# Patient Record
Sex: Female | Born: 1935
Health system: Southern US, Community
[De-identification: ages and names within clinical notes are randomized; demographics above are authoritative.]

## PROBLEM LIST (undated history)

## (undated) DIAGNOSIS — I340 Nonrheumatic mitral (valve) insufficiency: Secondary | ICD-10-CM

## (undated) DIAGNOSIS — M810 Age-related osteoporosis without current pathological fracture: Secondary | ICD-10-CM

## (undated) DIAGNOSIS — I10 Essential (primary) hypertension: Secondary | ICD-10-CM

## (undated) DIAGNOSIS — E7800 Pure hypercholesterolemia, unspecified: Secondary | ICD-10-CM

## (undated) DIAGNOSIS — G4730 Sleep apnea, unspecified: Secondary | ICD-10-CM

## (undated) DIAGNOSIS — I509 Heart failure, unspecified: Secondary | ICD-10-CM

## (undated) DIAGNOSIS — I4891 Unspecified atrial fibrillation: Secondary | ICD-10-CM

## (undated) DIAGNOSIS — F0390 Unspecified dementia without behavioral disturbance: Secondary | ICD-10-CM

## (undated) HISTORY — DX: Sleep apnea, unspecified: G47.30

## (undated) HISTORY — DX: Unspecified atrial fibrillation: I48.91

## (undated) HISTORY — DX: Age-related osteoporosis without current pathological fracture: M81.0

## (undated) HISTORY — DX: Nonrheumatic mitral (valve) insufficiency: I34.0

## (undated) HISTORY — DX: Pure hypercholesterolemia, unspecified: E78.00

## (undated) HISTORY — DX: Essential (primary) hypertension: I10

## (undated) HISTORY — DX: Unspecified dementia, unspecified severity, without behavioral disturbance, psychotic disturbance, mood disturbance, and anxiety: F03.90

---

## 2004-05-01 ENCOUNTER — Emergency Department (HOSPITAL_COMMUNITY): Admission: EM | Admit: 2004-05-01 | Discharge: 2004-05-01 | Payer: Self-pay | Admitting: *Deleted

## 2005-08-21 IMAGING — CR DG CERVICAL SPINE COMPLETE 4+V
6 series · 6 of 6 positions shown · non-contrast
Comparison: none

CLINICAL DATA: Fell onto face.  Posterior neck pain. 
 CERVICAL SPINE 5 VIEWS

[view not recorded (1 of 6)]
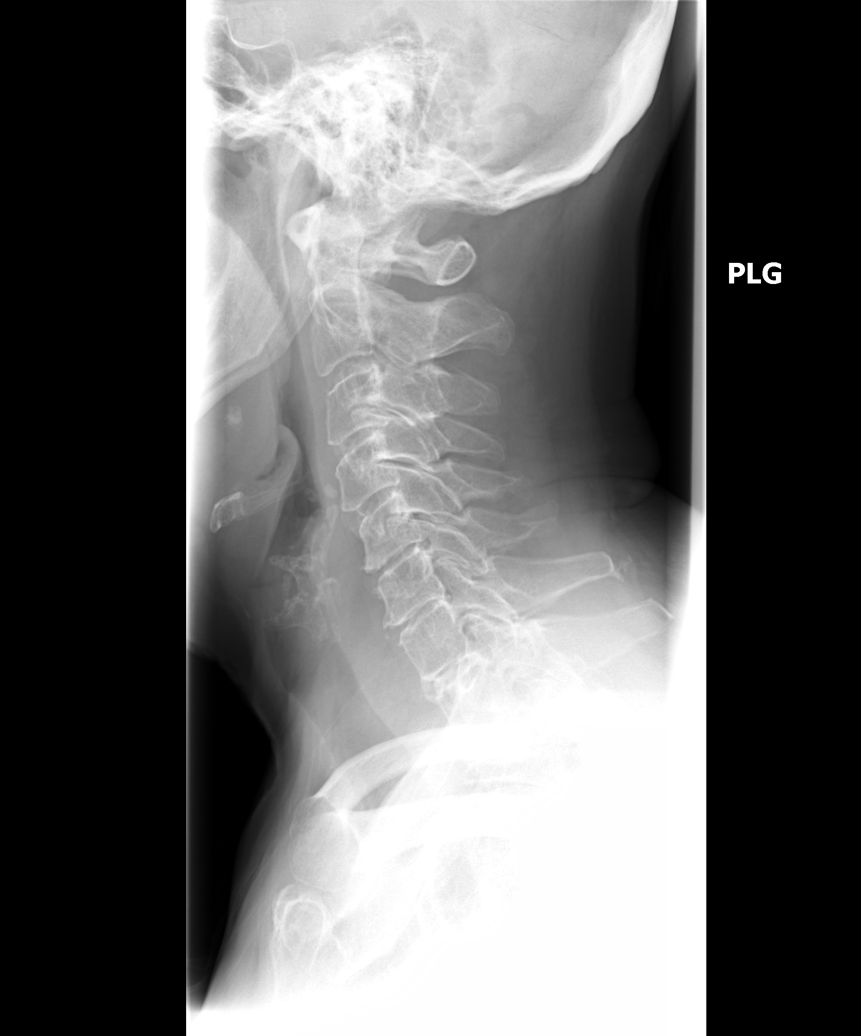

[view not recorded (2 of 6)]
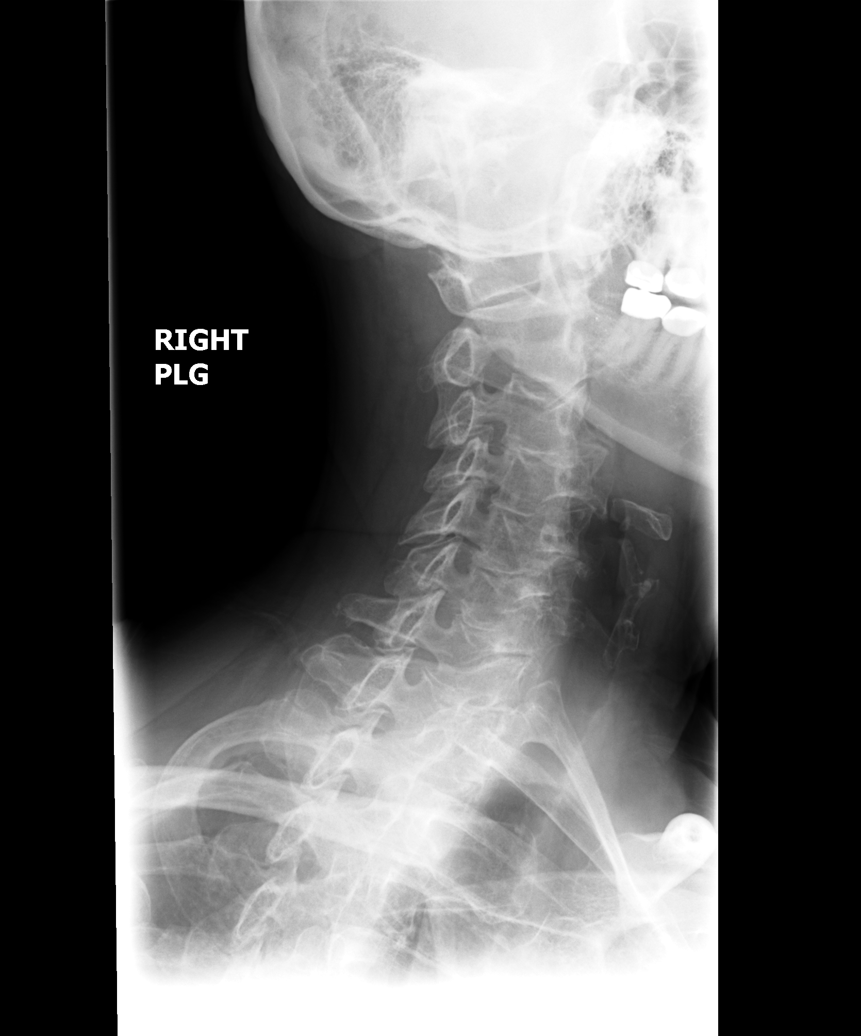

[view not recorded (3 of 6)]
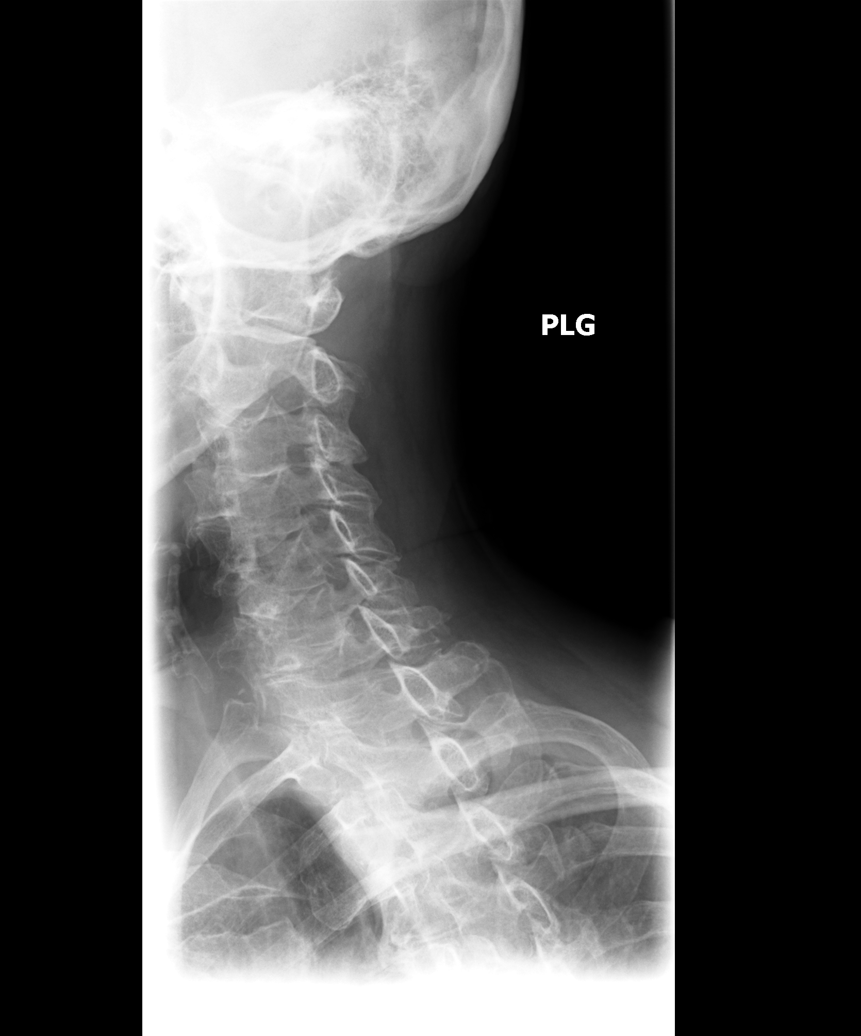

[view not recorded (4 of 6)]
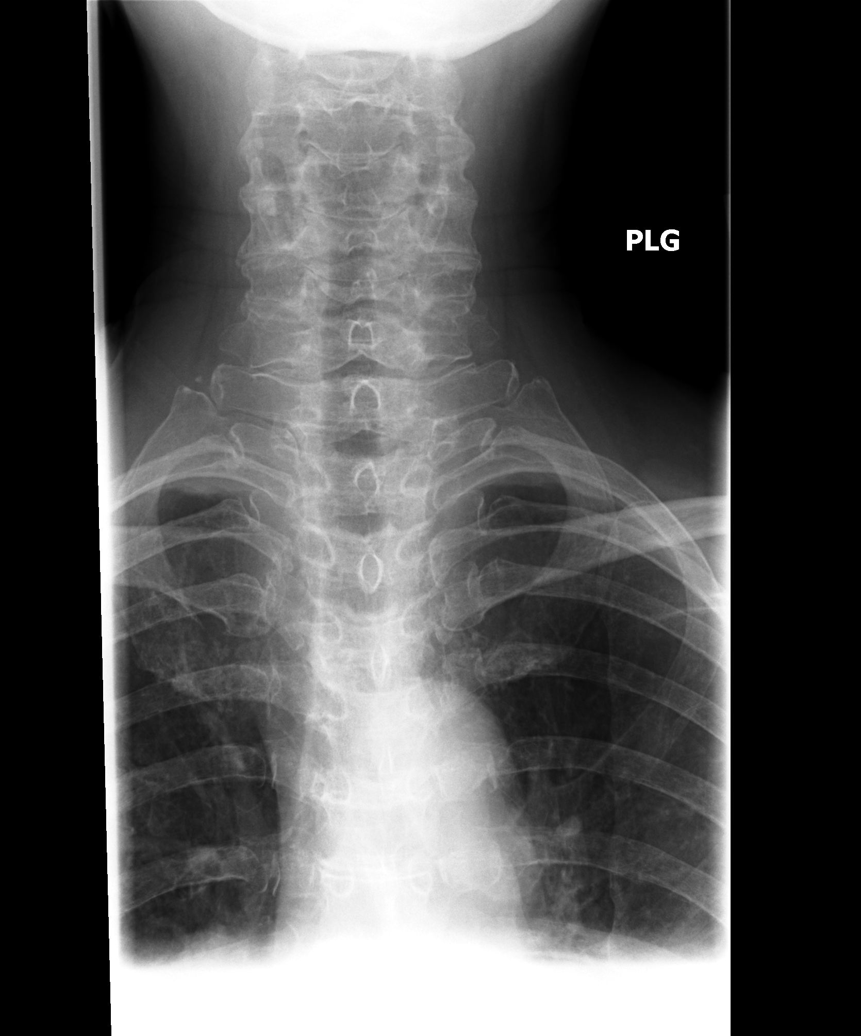

[view not recorded (5 of 6)]
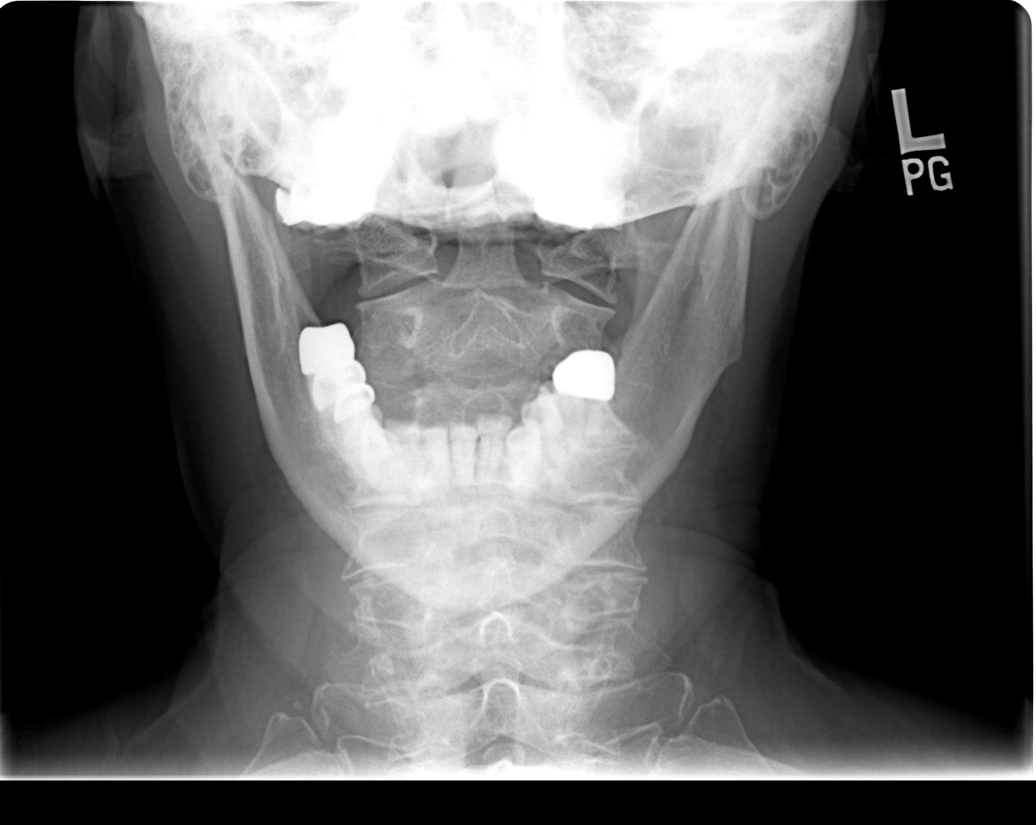

[view not recorded (6 of 6)]
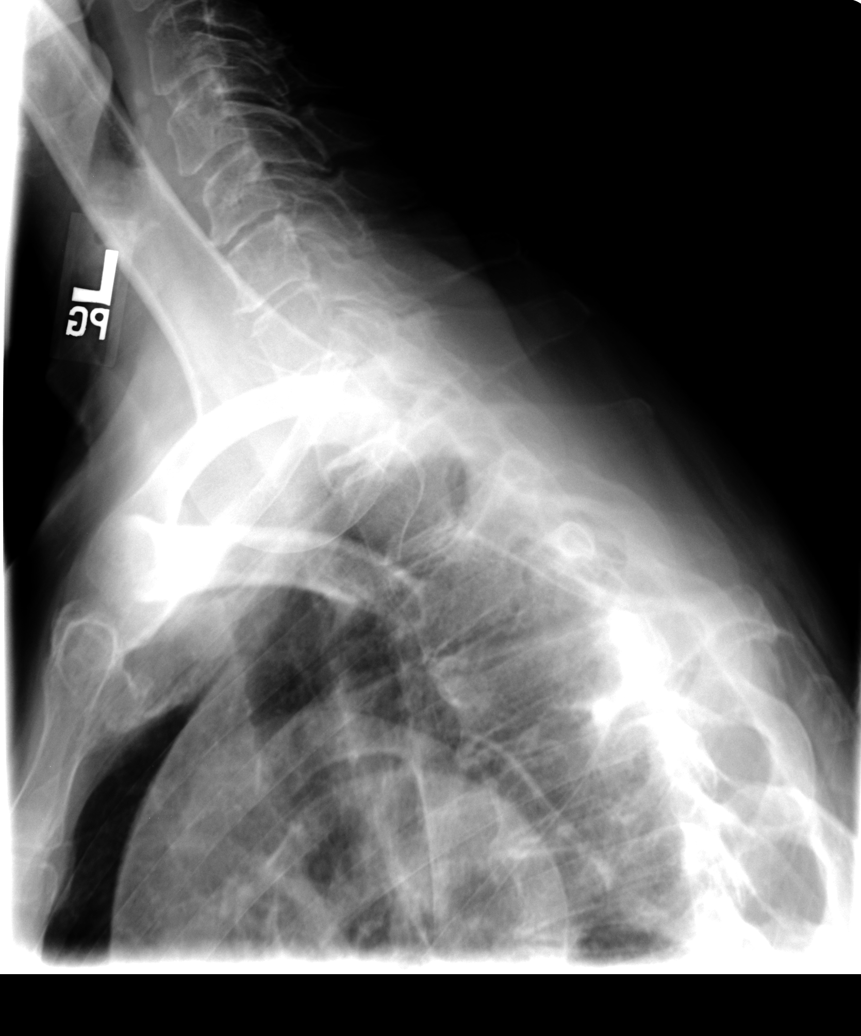

[6 of 6 positions shown; findings below may reference images not displayed]

FINDINGS: Narrowing of interspaces from C3 through C7 with small end-plate spurs at all levels.  There is also anterior spurring at C7-T1.  Normal alignment.  Uncovertebral hypertrophy results in some early neuroforaminal encroachment at C3-4, C4-5, and C5-6.  Negative for fracture or other acute abnormality.
IMPRESSION: Negative for fracture. 
 Multilevel degenerative changes, as above.

## 2013-09-24 DIAGNOSIS — I340 Nonrheumatic mitral (valve) insufficiency: Secondary | ICD-10-CM

## 2013-09-24 HISTORY — DX: Nonrheumatic mitral (valve) insufficiency: I34.0

## 2015-10-01 DIAGNOSIS — I482 Chronic atrial fibrillation: Secondary | ICD-10-CM | POA: Diagnosis not present

## 2015-10-10 DIAGNOSIS — N390 Urinary tract infection, site not specified: Secondary | ICD-10-CM | POA: Diagnosis not present

## 2015-10-10 DIAGNOSIS — N812 Incomplete uterovaginal prolapse: Secondary | ICD-10-CM | POA: Diagnosis not present

## 2015-10-10 DIAGNOSIS — N816 Rectocele: Secondary | ICD-10-CM | POA: Diagnosis not present

## 2015-10-31 DIAGNOSIS — N993 Prolapse of vaginal vault after hysterectomy: Secondary | ICD-10-CM | POA: Diagnosis not present

## 2015-12-02 DIAGNOSIS — I482 Chronic atrial fibrillation: Secondary | ICD-10-CM | POA: Diagnosis not present

## 2015-12-27 DIAGNOSIS — N993 Prolapse of vaginal vault after hysterectomy: Secondary | ICD-10-CM | POA: Diagnosis not present

## 2015-12-29 DIAGNOSIS — W108XXA Fall (on) (from) other stairs and steps, initial encounter: Secondary | ICD-10-CM | POA: Diagnosis not present

## 2015-12-29 DIAGNOSIS — I340 Nonrheumatic mitral (valve) insufficiency: Secondary | ICD-10-CM | POA: Diagnosis not present

## 2015-12-29 DIAGNOSIS — I482 Chronic atrial fibrillation: Secondary | ICD-10-CM | POA: Diagnosis not present

## 2015-12-29 DIAGNOSIS — I351 Nonrheumatic aortic (valve) insufficiency: Secondary | ICD-10-CM | POA: Diagnosis not present

## 2015-12-29 DIAGNOSIS — G4730 Sleep apnea, unspecified: Secondary | ICD-10-CM | POA: Diagnosis not present

## 2015-12-29 DIAGNOSIS — I10 Essential (primary) hypertension: Secondary | ICD-10-CM | POA: Diagnosis not present

## 2016-01-04 DIAGNOSIS — I482 Chronic atrial fibrillation: Secondary | ICD-10-CM | POA: Diagnosis not present

## 2016-01-04 DIAGNOSIS — N813 Complete uterovaginal prolapse: Secondary | ICD-10-CM | POA: Diagnosis not present

## 2016-02-15 DIAGNOSIS — R399 Unspecified symptoms and signs involving the genitourinary system: Secondary | ICD-10-CM | POA: Diagnosis not present

## 2016-02-15 DIAGNOSIS — M810 Age-related osteoporosis without current pathological fracture: Secondary | ICD-10-CM | POA: Diagnosis not present

## 2016-02-15 DIAGNOSIS — I4891 Unspecified atrial fibrillation: Secondary | ICD-10-CM | POA: Diagnosis not present

## 2016-02-15 DIAGNOSIS — Z1231 Encounter for screening mammogram for malignant neoplasm of breast: Secondary | ICD-10-CM | POA: Diagnosis not present

## 2016-02-15 DIAGNOSIS — E785 Hyperlipidemia, unspecified: Secondary | ICD-10-CM | POA: Diagnosis not present

## 2016-02-15 DIAGNOSIS — I10 Essential (primary) hypertension: Secondary | ICD-10-CM | POA: Diagnosis not present

## 2016-02-15 DIAGNOSIS — F0390 Unspecified dementia without behavioral disturbance: Secondary | ICD-10-CM | POA: Diagnosis not present

## 2016-03-01 DIAGNOSIS — I482 Chronic atrial fibrillation: Secondary | ICD-10-CM | POA: Diagnosis not present

## 2016-03-29 DIAGNOSIS — S42242D 4-part fracture of surgical neck of left humerus, subsequent encounter for fracture with routine healing: Secondary | ICD-10-CM | POA: Diagnosis not present

## 2016-04-25 DIAGNOSIS — I482 Chronic atrial fibrillation: Secondary | ICD-10-CM | POA: Diagnosis not present

## 2016-06-06 DIAGNOSIS — I482 Chronic atrial fibrillation: Secondary | ICD-10-CM | POA: Diagnosis not present

## 2016-06-28 DIAGNOSIS — G4730 Sleep apnea, unspecified: Secondary | ICD-10-CM | POA: Diagnosis not present

## 2016-06-28 DIAGNOSIS — I482 Chronic atrial fibrillation: Secondary | ICD-10-CM | POA: Diagnosis not present

## 2016-06-28 DIAGNOSIS — I10 Essential (primary) hypertension: Secondary | ICD-10-CM | POA: Diagnosis not present

## 2016-06-28 DIAGNOSIS — W108XXA Fall (on) (from) other stairs and steps, initial encounter: Secondary | ICD-10-CM | POA: Diagnosis not present

## 2016-06-28 DIAGNOSIS — I351 Nonrheumatic aortic (valve) insufficiency: Secondary | ICD-10-CM | POA: Diagnosis not present

## 2016-06-28 DIAGNOSIS — I340 Nonrheumatic mitral (valve) insufficiency: Secondary | ICD-10-CM | POA: Diagnosis not present

## 2016-07-03 DIAGNOSIS — Z23 Encounter for immunization: Secondary | ICD-10-CM | POA: Diagnosis not present

## 2016-07-27 DIAGNOSIS — I482 Chronic atrial fibrillation: Secondary | ICD-10-CM | POA: Diagnosis not present

## 2016-09-28 DIAGNOSIS — M810 Age-related osteoporosis without current pathological fracture: Secondary | ICD-10-CM | POA: Diagnosis not present

## 2016-09-28 DIAGNOSIS — E785 Hyperlipidemia, unspecified: Secondary | ICD-10-CM | POA: Diagnosis not present

## 2016-09-28 DIAGNOSIS — Z7901 Long term (current) use of anticoagulants: Secondary | ICD-10-CM | POA: Diagnosis not present

## 2016-09-28 DIAGNOSIS — G4730 Sleep apnea, unspecified: Secondary | ICD-10-CM | POA: Diagnosis not present

## 2016-09-28 DIAGNOSIS — I4891 Unspecified atrial fibrillation: Secondary | ICD-10-CM | POA: Diagnosis not present

## 2016-09-28 DIAGNOSIS — I10 Essential (primary) hypertension: Secondary | ICD-10-CM | POA: Diagnosis not present

## 2016-09-28 DIAGNOSIS — Z780 Asymptomatic menopausal state: Secondary | ICD-10-CM | POA: Diagnosis not present

## 2016-12-27 DIAGNOSIS — W108XXA Fall (on) (from) other stairs and steps, initial encounter: Secondary | ICD-10-CM | POA: Diagnosis not present

## 2016-12-27 DIAGNOSIS — I10 Essential (primary) hypertension: Secondary | ICD-10-CM | POA: Diagnosis not present

## 2016-12-27 DIAGNOSIS — G4730 Sleep apnea, unspecified: Secondary | ICD-10-CM | POA: Diagnosis not present

## 2016-12-27 DIAGNOSIS — I482 Chronic atrial fibrillation: Secondary | ICD-10-CM | POA: Diagnosis not present

## 2016-12-27 DIAGNOSIS — I351 Nonrheumatic aortic (valve) insufficiency: Secondary | ICD-10-CM | POA: Diagnosis not present

## 2016-12-27 DIAGNOSIS — I340 Nonrheumatic mitral (valve) insufficiency: Secondary | ICD-10-CM | POA: Diagnosis not present

## 2017-01-18 DIAGNOSIS — I482 Chronic atrial fibrillation: Secondary | ICD-10-CM | POA: Diagnosis not present

## 2017-02-05 DIAGNOSIS — E7800 Pure hypercholesterolemia, unspecified: Secondary | ICD-10-CM | POA: Diagnosis not present

## 2017-02-05 DIAGNOSIS — I4891 Unspecified atrial fibrillation: Secondary | ICD-10-CM | POA: Diagnosis not present

## 2017-02-05 DIAGNOSIS — G4730 Sleep apnea, unspecified: Secondary | ICD-10-CM | POA: Diagnosis not present

## 2017-02-05 DIAGNOSIS — Z9181 History of falling: Secondary | ICD-10-CM | POA: Diagnosis not present

## 2017-02-05 DIAGNOSIS — Z79899 Other long term (current) drug therapy: Secondary | ICD-10-CM | POA: Diagnosis not present

## 2017-02-05 DIAGNOSIS — T461X6A Underdosing of calcium-channel blockers, initial encounter: Secondary | ICD-10-CM | POA: Diagnosis not present

## 2017-02-05 DIAGNOSIS — I482 Chronic atrial fibrillation: Secondary | ICD-10-CM | POA: Diagnosis not present

## 2017-02-05 DIAGNOSIS — J90 Pleural effusion, not elsewhere classified: Secondary | ICD-10-CM | POA: Diagnosis not present

## 2017-02-05 DIAGNOSIS — I5031 Acute diastolic (congestive) heart failure: Secondary | ICD-10-CM | POA: Diagnosis not present

## 2017-02-05 DIAGNOSIS — I081 Rheumatic disorders of both mitral and tricuspid valves: Secondary | ICD-10-CM | POA: Diagnosis not present

## 2017-02-05 DIAGNOSIS — Z91138 Patient's unintentional underdosing of medication regimen for other reason: Secondary | ICD-10-CM | POA: Diagnosis not present

## 2017-02-05 DIAGNOSIS — I5021 Acute systolic (congestive) heart failure: Secondary | ICD-10-CM | POA: Diagnosis not present

## 2017-02-05 DIAGNOSIS — I10 Essential (primary) hypertension: Secondary | ICD-10-CM | POA: Diagnosis not present

## 2017-02-05 DIAGNOSIS — I080 Rheumatic disorders of both mitral and aortic valves: Secondary | ICD-10-CM | POA: Diagnosis not present

## 2017-02-05 DIAGNOSIS — Z7901 Long term (current) use of anticoagulants: Secondary | ICD-10-CM | POA: Diagnosis not present

## 2017-02-05 DIAGNOSIS — I110 Hypertensive heart disease with heart failure: Secondary | ICD-10-CM | POA: Diagnosis not present

## 2017-02-05 DIAGNOSIS — I509 Heart failure, unspecified: Secondary | ICD-10-CM | POA: Diagnosis not present

## 2017-02-05 DIAGNOSIS — Z66 Do not resuscitate: Secondary | ICD-10-CM | POA: Diagnosis not present

## 2017-02-06 DIAGNOSIS — I080 Rheumatic disorders of both mitral and aortic valves: Secondary | ICD-10-CM | POA: Diagnosis not present

## 2017-02-06 DIAGNOSIS — I5021 Acute systolic (congestive) heart failure: Secondary | ICD-10-CM | POA: Diagnosis not present

## 2017-02-06 DIAGNOSIS — I482 Chronic atrial fibrillation: Secondary | ICD-10-CM | POA: Diagnosis not present

## 2017-02-06 DIAGNOSIS — I361 Nonrheumatic tricuspid (valve) insufficiency: Secondary | ICD-10-CM | POA: Diagnosis not present

## 2017-02-06 DIAGNOSIS — I5031 Acute diastolic (congestive) heart failure: Secondary | ICD-10-CM | POA: Diagnosis not present

## 2017-02-06 DIAGNOSIS — I4891 Unspecified atrial fibrillation: Secondary | ICD-10-CM | POA: Diagnosis not present

## 2017-02-06 DIAGNOSIS — I509 Heart failure, unspecified: Secondary | ICD-10-CM | POA: Diagnosis not present

## 2017-02-06 DIAGNOSIS — Z79899 Other long term (current) drug therapy: Secondary | ICD-10-CM | POA: Diagnosis not present

## 2017-02-06 DIAGNOSIS — G4730 Sleep apnea, unspecified: Secondary | ICD-10-CM | POA: Diagnosis not present

## 2017-02-06 DIAGNOSIS — I10 Essential (primary) hypertension: Secondary | ICD-10-CM | POA: Diagnosis not present

## 2017-02-06 DIAGNOSIS — Z7901 Long term (current) use of anticoagulants: Secondary | ICD-10-CM | POA: Diagnosis not present

## 2017-02-06 DIAGNOSIS — Z66 Do not resuscitate: Secondary | ICD-10-CM | POA: Diagnosis not present

## 2017-02-06 DIAGNOSIS — I340 Nonrheumatic mitral (valve) insufficiency: Secondary | ICD-10-CM | POA: Diagnosis not present

## 2017-02-07 DIAGNOSIS — I5021 Acute systolic (congestive) heart failure: Secondary | ICD-10-CM | POA: Diagnosis not present

## 2017-02-07 DIAGNOSIS — Z66 Do not resuscitate: Secondary | ICD-10-CM | POA: Diagnosis not present

## 2017-02-07 DIAGNOSIS — I080 Rheumatic disorders of both mitral and aortic valves: Secondary | ICD-10-CM | POA: Diagnosis not present

## 2017-02-07 DIAGNOSIS — Z79899 Other long term (current) drug therapy: Secondary | ICD-10-CM | POA: Diagnosis not present

## 2017-02-07 DIAGNOSIS — I10 Essential (primary) hypertension: Secondary | ICD-10-CM | POA: Diagnosis not present

## 2017-02-07 DIAGNOSIS — I5031 Acute diastolic (congestive) heart failure: Secondary | ICD-10-CM | POA: Diagnosis not present

## 2017-02-07 DIAGNOSIS — G4730 Sleep apnea, unspecified: Secondary | ICD-10-CM | POA: Diagnosis not present

## 2017-02-07 DIAGNOSIS — I4891 Unspecified atrial fibrillation: Secondary | ICD-10-CM | POA: Diagnosis not present

## 2017-02-07 DIAGNOSIS — Z7901 Long term (current) use of anticoagulants: Secondary | ICD-10-CM | POA: Diagnosis not present

## 2017-02-07 DIAGNOSIS — I482 Chronic atrial fibrillation: Secondary | ICD-10-CM | POA: Diagnosis not present

## 2017-02-08 DIAGNOSIS — G4730 Sleep apnea, unspecified: Secondary | ICD-10-CM | POA: Diagnosis not present

## 2017-02-08 DIAGNOSIS — Z5181 Encounter for therapeutic drug level monitoring: Secondary | ICD-10-CM | POA: Diagnosis not present

## 2017-02-08 DIAGNOSIS — I110 Hypertensive heart disease with heart failure: Secondary | ICD-10-CM | POA: Diagnosis not present

## 2017-02-08 DIAGNOSIS — Z7901 Long term (current) use of anticoagulants: Secondary | ICD-10-CM | POA: Diagnosis not present

## 2017-02-08 DIAGNOSIS — I083 Combined rheumatic disorders of mitral, aortic and tricuspid valves: Secondary | ICD-10-CM | POA: Diagnosis not present

## 2017-02-08 DIAGNOSIS — I482 Chronic atrial fibrillation: Secondary | ICD-10-CM | POA: Diagnosis not present

## 2017-02-08 DIAGNOSIS — Z9181 History of falling: Secondary | ICD-10-CM | POA: Diagnosis not present

## 2017-02-08 DIAGNOSIS — I5031 Acute diastolic (congestive) heart failure: Secondary | ICD-10-CM | POA: Diagnosis not present

## 2017-02-08 DIAGNOSIS — I272 Pulmonary hypertension, unspecified: Secondary | ICD-10-CM | POA: Diagnosis not present

## 2017-02-09 DIAGNOSIS — I5031 Acute diastolic (congestive) heart failure: Secondary | ICD-10-CM | POA: Diagnosis not present

## 2017-02-09 DIAGNOSIS — G4730 Sleep apnea, unspecified: Secondary | ICD-10-CM | POA: Diagnosis not present

## 2017-02-09 DIAGNOSIS — I272 Pulmonary hypertension, unspecified: Secondary | ICD-10-CM | POA: Diagnosis not present

## 2017-02-09 DIAGNOSIS — I083 Combined rheumatic disorders of mitral, aortic and tricuspid valves: Secondary | ICD-10-CM | POA: Diagnosis not present

## 2017-02-09 DIAGNOSIS — I110 Hypertensive heart disease with heart failure: Secondary | ICD-10-CM | POA: Diagnosis not present

## 2017-02-09 DIAGNOSIS — I482 Chronic atrial fibrillation: Secondary | ICD-10-CM | POA: Diagnosis not present

## 2017-02-11 DIAGNOSIS — G4730 Sleep apnea, unspecified: Secondary | ICD-10-CM | POA: Diagnosis not present

## 2017-02-11 DIAGNOSIS — I272 Pulmonary hypertension, unspecified: Secondary | ICD-10-CM | POA: Diagnosis not present

## 2017-02-11 DIAGNOSIS — I5031 Acute diastolic (congestive) heart failure: Secondary | ICD-10-CM | POA: Diagnosis not present

## 2017-02-11 DIAGNOSIS — I110 Hypertensive heart disease with heart failure: Secondary | ICD-10-CM | POA: Diagnosis not present

## 2017-02-11 DIAGNOSIS — I083 Combined rheumatic disorders of mitral, aortic and tricuspid valves: Secondary | ICD-10-CM | POA: Diagnosis not present

## 2017-02-11 DIAGNOSIS — I482 Chronic atrial fibrillation: Secondary | ICD-10-CM | POA: Diagnosis not present

## 2017-02-12 DIAGNOSIS — Z713 Dietary counseling and surveillance: Secondary | ICD-10-CM | POA: Diagnosis not present

## 2017-02-12 DIAGNOSIS — Z7901 Long term (current) use of anticoagulants: Secondary | ICD-10-CM | POA: Diagnosis not present

## 2017-02-12 DIAGNOSIS — G4730 Sleep apnea, unspecified: Secondary | ICD-10-CM | POA: Diagnosis not present

## 2017-02-12 DIAGNOSIS — I272 Pulmonary hypertension, unspecified: Secondary | ICD-10-CM | POA: Diagnosis not present

## 2017-02-12 DIAGNOSIS — I110 Hypertensive heart disease with heart failure: Secondary | ICD-10-CM | POA: Diagnosis not present

## 2017-02-12 DIAGNOSIS — I4891 Unspecified atrial fibrillation: Secondary | ICD-10-CM | POA: Diagnosis not present

## 2017-02-12 DIAGNOSIS — I482 Chronic atrial fibrillation: Secondary | ICD-10-CM | POA: Diagnosis not present

## 2017-02-12 DIAGNOSIS — I083 Combined rheumatic disorders of mitral, aortic and tricuspid valves: Secondary | ICD-10-CM | POA: Diagnosis not present

## 2017-02-12 DIAGNOSIS — F0390 Unspecified dementia without behavioral disturbance: Secondary | ICD-10-CM | POA: Diagnosis not present

## 2017-02-12 DIAGNOSIS — I5031 Acute diastolic (congestive) heart failure: Secondary | ICD-10-CM | POA: Diagnosis not present

## 2017-02-12 DIAGNOSIS — E785 Hyperlipidemia, unspecified: Secondary | ICD-10-CM | POA: Diagnosis not present

## 2017-02-12 DIAGNOSIS — I10 Essential (primary) hypertension: Secondary | ICD-10-CM | POA: Diagnosis not present

## 2017-02-13 DIAGNOSIS — I5031 Acute diastolic (congestive) heart failure: Secondary | ICD-10-CM | POA: Diagnosis not present

## 2017-02-13 DIAGNOSIS — I482 Chronic atrial fibrillation: Secondary | ICD-10-CM | POA: Diagnosis not present

## 2017-02-13 DIAGNOSIS — I272 Pulmonary hypertension, unspecified: Secondary | ICD-10-CM | POA: Diagnosis not present

## 2017-02-13 DIAGNOSIS — I083 Combined rheumatic disorders of mitral, aortic and tricuspid valves: Secondary | ICD-10-CM | POA: Diagnosis not present

## 2017-02-13 DIAGNOSIS — I110 Hypertensive heart disease with heart failure: Secondary | ICD-10-CM | POA: Diagnosis not present

## 2017-02-13 DIAGNOSIS — G4730 Sleep apnea, unspecified: Secondary | ICD-10-CM | POA: Diagnosis not present

## 2017-02-14 DIAGNOSIS — G4730 Sleep apnea, unspecified: Secondary | ICD-10-CM | POA: Diagnosis not present

## 2017-02-14 DIAGNOSIS — I5031 Acute diastolic (congestive) heart failure: Secondary | ICD-10-CM | POA: Diagnosis not present

## 2017-02-14 DIAGNOSIS — I482 Chronic atrial fibrillation: Secondary | ICD-10-CM | POA: Diagnosis not present

## 2017-02-14 DIAGNOSIS — I110 Hypertensive heart disease with heart failure: Secondary | ICD-10-CM | POA: Diagnosis not present

## 2017-02-14 DIAGNOSIS — I10 Essential (primary) hypertension: Secondary | ICD-10-CM | POA: Diagnosis not present

## 2017-02-14 DIAGNOSIS — I083 Combined rheumatic disorders of mitral, aortic and tricuspid valves: Secondary | ICD-10-CM | POA: Diagnosis not present

## 2017-02-14 DIAGNOSIS — I351 Nonrheumatic aortic (valve) insufficiency: Secondary | ICD-10-CM | POA: Diagnosis not present

## 2017-02-14 DIAGNOSIS — I272 Pulmonary hypertension, unspecified: Secondary | ICD-10-CM | POA: Diagnosis not present

## 2017-02-19 DIAGNOSIS — I083 Combined rheumatic disorders of mitral, aortic and tricuspid valves: Secondary | ICD-10-CM | POA: Diagnosis not present

## 2017-02-19 DIAGNOSIS — I272 Pulmonary hypertension, unspecified: Secondary | ICD-10-CM | POA: Diagnosis not present

## 2017-02-19 DIAGNOSIS — I482 Chronic atrial fibrillation: Secondary | ICD-10-CM | POA: Diagnosis not present

## 2017-02-19 DIAGNOSIS — I5031 Acute diastolic (congestive) heart failure: Secondary | ICD-10-CM | POA: Diagnosis not present

## 2017-02-19 DIAGNOSIS — I110 Hypertensive heart disease with heart failure: Secondary | ICD-10-CM | POA: Diagnosis not present

## 2017-02-19 DIAGNOSIS — G4730 Sleep apnea, unspecified: Secondary | ICD-10-CM | POA: Diagnosis not present

## 2017-02-20 DIAGNOSIS — I482 Chronic atrial fibrillation: Secondary | ICD-10-CM | POA: Diagnosis not present

## 2017-02-20 DIAGNOSIS — I110 Hypertensive heart disease with heart failure: Secondary | ICD-10-CM | POA: Diagnosis not present

## 2017-02-20 DIAGNOSIS — I5031 Acute diastolic (congestive) heart failure: Secondary | ICD-10-CM | POA: Diagnosis not present

## 2017-02-20 DIAGNOSIS — I272 Pulmonary hypertension, unspecified: Secondary | ICD-10-CM | POA: Diagnosis not present

## 2017-02-20 DIAGNOSIS — G4730 Sleep apnea, unspecified: Secondary | ICD-10-CM | POA: Diagnosis not present

## 2017-02-20 DIAGNOSIS — I083 Combined rheumatic disorders of mitral, aortic and tricuspid valves: Secondary | ICD-10-CM | POA: Diagnosis not present

## 2017-02-22 DIAGNOSIS — I5031 Acute diastolic (congestive) heart failure: Secondary | ICD-10-CM | POA: Diagnosis not present

## 2017-02-22 DIAGNOSIS — I110 Hypertensive heart disease with heart failure: Secondary | ICD-10-CM | POA: Diagnosis not present

## 2017-02-22 DIAGNOSIS — I083 Combined rheumatic disorders of mitral, aortic and tricuspid valves: Secondary | ICD-10-CM | POA: Diagnosis not present

## 2017-02-22 DIAGNOSIS — I272 Pulmonary hypertension, unspecified: Secondary | ICD-10-CM | POA: Diagnosis not present

## 2017-02-22 DIAGNOSIS — G4730 Sleep apnea, unspecified: Secondary | ICD-10-CM | POA: Diagnosis not present

## 2017-02-22 DIAGNOSIS — I482 Chronic atrial fibrillation: Secondary | ICD-10-CM | POA: Diagnosis not present

## 2017-02-25 DIAGNOSIS — I110 Hypertensive heart disease with heart failure: Secondary | ICD-10-CM | POA: Diagnosis not present

## 2017-02-25 DIAGNOSIS — I482 Chronic atrial fibrillation: Secondary | ICD-10-CM | POA: Diagnosis not present

## 2017-02-25 DIAGNOSIS — I272 Pulmonary hypertension, unspecified: Secondary | ICD-10-CM | POA: Diagnosis not present

## 2017-02-25 DIAGNOSIS — G4730 Sleep apnea, unspecified: Secondary | ICD-10-CM | POA: Diagnosis not present

## 2017-02-25 DIAGNOSIS — I5031 Acute diastolic (congestive) heart failure: Secondary | ICD-10-CM | POA: Diagnosis not present

## 2017-02-25 DIAGNOSIS — I083 Combined rheumatic disorders of mitral, aortic and tricuspid valves: Secondary | ICD-10-CM | POA: Diagnosis not present

## 2017-02-26 DIAGNOSIS — I083 Combined rheumatic disorders of mitral, aortic and tricuspid valves: Secondary | ICD-10-CM | POA: Diagnosis not present

## 2017-02-26 DIAGNOSIS — G4730 Sleep apnea, unspecified: Secondary | ICD-10-CM | POA: Diagnosis not present

## 2017-02-26 DIAGNOSIS — I482 Chronic atrial fibrillation: Secondary | ICD-10-CM | POA: Diagnosis not present

## 2017-02-26 DIAGNOSIS — I272 Pulmonary hypertension, unspecified: Secondary | ICD-10-CM | POA: Diagnosis not present

## 2017-02-26 DIAGNOSIS — I5031 Acute diastolic (congestive) heart failure: Secondary | ICD-10-CM | POA: Diagnosis not present

## 2017-02-26 DIAGNOSIS — I110 Hypertensive heart disease with heart failure: Secondary | ICD-10-CM | POA: Diagnosis not present

## 2017-02-27 DIAGNOSIS — I083 Combined rheumatic disorders of mitral, aortic and tricuspid valves: Secondary | ICD-10-CM | POA: Diagnosis not present

## 2017-02-27 DIAGNOSIS — I272 Pulmonary hypertension, unspecified: Secondary | ICD-10-CM | POA: Diagnosis not present

## 2017-02-27 DIAGNOSIS — I482 Chronic atrial fibrillation: Secondary | ICD-10-CM | POA: Diagnosis not present

## 2017-02-27 DIAGNOSIS — G4730 Sleep apnea, unspecified: Secondary | ICD-10-CM | POA: Diagnosis not present

## 2017-02-27 DIAGNOSIS — I5031 Acute diastolic (congestive) heart failure: Secondary | ICD-10-CM | POA: Diagnosis not present

## 2017-02-27 DIAGNOSIS — I110 Hypertensive heart disease with heart failure: Secondary | ICD-10-CM | POA: Diagnosis not present

## 2017-03-04 DIAGNOSIS — I5031 Acute diastolic (congestive) heart failure: Secondary | ICD-10-CM | POA: Diagnosis not present

## 2017-03-04 DIAGNOSIS — I083 Combined rheumatic disorders of mitral, aortic and tricuspid valves: Secondary | ICD-10-CM | POA: Diagnosis not present

## 2017-03-04 DIAGNOSIS — I110 Hypertensive heart disease with heart failure: Secondary | ICD-10-CM | POA: Diagnosis not present

## 2017-03-04 DIAGNOSIS — I272 Pulmonary hypertension, unspecified: Secondary | ICD-10-CM | POA: Diagnosis not present

## 2017-03-04 DIAGNOSIS — I482 Chronic atrial fibrillation: Secondary | ICD-10-CM | POA: Diagnosis not present

## 2017-03-04 DIAGNOSIS — G4730 Sleep apnea, unspecified: Secondary | ICD-10-CM | POA: Diagnosis not present

## 2017-03-06 DIAGNOSIS — I482 Chronic atrial fibrillation: Secondary | ICD-10-CM | POA: Diagnosis not present

## 2017-03-06 DIAGNOSIS — I5031 Acute diastolic (congestive) heart failure: Secondary | ICD-10-CM | POA: Diagnosis not present

## 2017-03-06 DIAGNOSIS — I110 Hypertensive heart disease with heart failure: Secondary | ICD-10-CM | POA: Diagnosis not present

## 2017-03-06 DIAGNOSIS — I083 Combined rheumatic disorders of mitral, aortic and tricuspid valves: Secondary | ICD-10-CM | POA: Diagnosis not present

## 2017-03-06 DIAGNOSIS — I272 Pulmonary hypertension, unspecified: Secondary | ICD-10-CM | POA: Diagnosis not present

## 2017-03-06 DIAGNOSIS — G4730 Sleep apnea, unspecified: Secondary | ICD-10-CM | POA: Diagnosis not present

## 2017-03-11 DIAGNOSIS — I110 Hypertensive heart disease with heart failure: Secondary | ICD-10-CM | POA: Diagnosis not present

## 2017-03-11 DIAGNOSIS — Z7901 Long term (current) use of anticoagulants: Secondary | ICD-10-CM | POA: Diagnosis not present

## 2017-03-11 DIAGNOSIS — I4891 Unspecified atrial fibrillation: Secondary | ICD-10-CM | POA: Diagnosis not present

## 2017-03-11 DIAGNOSIS — I5021 Acute systolic (congestive) heart failure: Secondary | ICD-10-CM | POA: Diagnosis not present

## 2017-03-11 DIAGNOSIS — I10 Essential (primary) hypertension: Secondary | ICD-10-CM | POA: Diagnosis not present

## 2017-03-11 DIAGNOSIS — G4730 Sleep apnea, unspecified: Secondary | ICD-10-CM | POA: Diagnosis not present

## 2017-03-11 DIAGNOSIS — I080 Rheumatic disorders of both mitral and aortic valves: Secondary | ICD-10-CM | POA: Diagnosis not present

## 2017-03-11 DIAGNOSIS — I5031 Acute diastolic (congestive) heart failure: Secondary | ICD-10-CM | POA: Diagnosis not present

## 2017-03-11 DIAGNOSIS — I482 Chronic atrial fibrillation: Secondary | ICD-10-CM | POA: Diagnosis not present

## 2017-03-11 DIAGNOSIS — Z66 Do not resuscitate: Secondary | ICD-10-CM | POA: Diagnosis not present

## 2017-03-11 DIAGNOSIS — Z79899 Other long term (current) drug therapy: Secondary | ICD-10-CM | POA: Diagnosis not present

## 2017-03-11 DIAGNOSIS — I083 Combined rheumatic disorders of mitral, aortic and tricuspid valves: Secondary | ICD-10-CM | POA: Diagnosis not present

## 2017-03-11 DIAGNOSIS — I272 Pulmonary hypertension, unspecified: Secondary | ICD-10-CM | POA: Diagnosis not present

## 2017-03-12 DIAGNOSIS — I110 Hypertensive heart disease with heart failure: Secondary | ICD-10-CM | POA: Diagnosis not present

## 2017-03-12 DIAGNOSIS — I272 Pulmonary hypertension, unspecified: Secondary | ICD-10-CM | POA: Diagnosis not present

## 2017-03-12 DIAGNOSIS — G4730 Sleep apnea, unspecified: Secondary | ICD-10-CM | POA: Diagnosis not present

## 2017-03-12 DIAGNOSIS — I083 Combined rheumatic disorders of mitral, aortic and tricuspid valves: Secondary | ICD-10-CM | POA: Diagnosis not present

## 2017-03-12 DIAGNOSIS — I482 Chronic atrial fibrillation: Secondary | ICD-10-CM | POA: Diagnosis not present

## 2017-03-12 DIAGNOSIS — I5031 Acute diastolic (congestive) heart failure: Secondary | ICD-10-CM | POA: Diagnosis not present

## 2017-03-14 DIAGNOSIS — G4730 Sleep apnea, unspecified: Secondary | ICD-10-CM | POA: Diagnosis not present

## 2017-03-14 DIAGNOSIS — I5031 Acute diastolic (congestive) heart failure: Secondary | ICD-10-CM | POA: Diagnosis not present

## 2017-03-14 DIAGNOSIS — I272 Pulmonary hypertension, unspecified: Secondary | ICD-10-CM | POA: Diagnosis not present

## 2017-03-14 DIAGNOSIS — I110 Hypertensive heart disease with heart failure: Secondary | ICD-10-CM | POA: Diagnosis not present

## 2017-03-14 DIAGNOSIS — I482 Chronic atrial fibrillation: Secondary | ICD-10-CM | POA: Diagnosis not present

## 2017-03-14 DIAGNOSIS — I083 Combined rheumatic disorders of mitral, aortic and tricuspid valves: Secondary | ICD-10-CM | POA: Diagnosis not present

## 2017-03-18 DIAGNOSIS — I110 Hypertensive heart disease with heart failure: Secondary | ICD-10-CM | POA: Diagnosis not present

## 2017-03-18 DIAGNOSIS — I5031 Acute diastolic (congestive) heart failure: Secondary | ICD-10-CM | POA: Diagnosis not present

## 2017-03-18 DIAGNOSIS — I083 Combined rheumatic disorders of mitral, aortic and tricuspid valves: Secondary | ICD-10-CM | POA: Diagnosis not present

## 2017-03-18 DIAGNOSIS — G4730 Sleep apnea, unspecified: Secondary | ICD-10-CM | POA: Diagnosis not present

## 2017-03-18 DIAGNOSIS — I272 Pulmonary hypertension, unspecified: Secondary | ICD-10-CM | POA: Diagnosis not present

## 2017-03-18 DIAGNOSIS — I482 Chronic atrial fibrillation: Secondary | ICD-10-CM | POA: Diagnosis not present

## 2017-04-23 DIAGNOSIS — I482 Chronic atrial fibrillation: Secondary | ICD-10-CM | POA: Diagnosis not present

## 2017-04-23 DIAGNOSIS — N183 Chronic kidney disease, stage 3 (moderate): Secondary | ICD-10-CM | POA: Diagnosis not present

## 2017-04-23 DIAGNOSIS — I129 Hypertensive chronic kidney disease with stage 1 through stage 4 chronic kidney disease, or unspecified chronic kidney disease: Secondary | ICD-10-CM | POA: Diagnosis not present

## 2017-04-23 DIAGNOSIS — G4733 Obstructive sleep apnea (adult) (pediatric): Secondary | ICD-10-CM | POA: Diagnosis not present

## 2017-04-23 DIAGNOSIS — Z7189 Other specified counseling: Secondary | ICD-10-CM | POA: Diagnosis not present

## 2017-04-23 DIAGNOSIS — I10 Essential (primary) hypertension: Secondary | ICD-10-CM | POA: Diagnosis not present

## 2017-04-23 DIAGNOSIS — Z79899 Other long term (current) drug therapy: Secondary | ICD-10-CM | POA: Diagnosis not present

## 2017-04-29 ENCOUNTER — Encounter: Payer: Self-pay | Admitting: Internal Medicine

## 2017-05-15 ENCOUNTER — Institutional Professional Consult (permissible substitution): Payer: Self-pay | Admitting: Internal Medicine

## 2017-05-21 DIAGNOSIS — N183 Chronic kidney disease, stage 3 (moderate): Secondary | ICD-10-CM | POA: Diagnosis not present

## 2017-05-21 DIAGNOSIS — I482 Chronic atrial fibrillation: Secondary | ICD-10-CM | POA: Diagnosis not present

## 2017-05-21 DIAGNOSIS — I129 Hypertensive chronic kidney disease with stage 1 through stage 4 chronic kidney disease, or unspecified chronic kidney disease: Secondary | ICD-10-CM | POA: Diagnosis not present

## 2017-06-06 ENCOUNTER — Institutional Professional Consult (permissible substitution): Payer: Self-pay | Admitting: Internal Medicine

## 2017-06-13 DIAGNOSIS — Z23 Encounter for immunization: Secondary | ICD-10-CM | POA: Diagnosis not present

## 2017-06-26 DIAGNOSIS — I482 Chronic atrial fibrillation: Secondary | ICD-10-CM | POA: Diagnosis not present

## 2017-07-12 DIAGNOSIS — M25512 Pain in left shoulder: Secondary | ICD-10-CM | POA: Diagnosis not present

## 2017-07-29 DIAGNOSIS — M7542 Impingement syndrome of left shoulder: Secondary | ICD-10-CM | POA: Diagnosis not present

## 2017-07-29 DIAGNOSIS — H6123 Impacted cerumen, bilateral: Secondary | ICD-10-CM | POA: Diagnosis not present

## 2017-08-13 DIAGNOSIS — R2681 Unsteadiness on feet: Secondary | ICD-10-CM | POA: Diagnosis not present

## 2017-08-13 DIAGNOSIS — M79602 Pain in left arm: Secondary | ICD-10-CM | POA: Diagnosis not present

## 2017-08-13 DIAGNOSIS — R488 Other symbolic dysfunctions: Secondary | ICD-10-CM | POA: Diagnosis not present

## 2017-08-13 DIAGNOSIS — M6281 Muscle weakness (generalized): Secondary | ICD-10-CM | POA: Diagnosis not present

## 2017-08-13 DIAGNOSIS — Z7901 Long term (current) use of anticoagulants: Secondary | ICD-10-CM | POA: Diagnosis not present

## 2017-08-14 DIAGNOSIS — R2681 Unsteadiness on feet: Secondary | ICD-10-CM | POA: Diagnosis not present

## 2017-08-14 DIAGNOSIS — M6281 Muscle weakness (generalized): Secondary | ICD-10-CM | POA: Diagnosis not present

## 2017-08-14 DIAGNOSIS — R488 Other symbolic dysfunctions: Secondary | ICD-10-CM | POA: Diagnosis not present

## 2017-08-14 DIAGNOSIS — M79602 Pain in left arm: Secondary | ICD-10-CM | POA: Diagnosis not present

## 2017-08-16 DIAGNOSIS — R2681 Unsteadiness on feet: Secondary | ICD-10-CM | POA: Diagnosis not present

## 2017-08-16 DIAGNOSIS — M6281 Muscle weakness (generalized): Secondary | ICD-10-CM | POA: Diagnosis not present

## 2017-08-16 DIAGNOSIS — R488 Other symbolic dysfunctions: Secondary | ICD-10-CM | POA: Diagnosis not present

## 2017-08-16 DIAGNOSIS — M79602 Pain in left arm: Secondary | ICD-10-CM | POA: Diagnosis not present

## 2017-08-19 DIAGNOSIS — G4733 Obstructive sleep apnea (adult) (pediatric): Secondary | ICD-10-CM | POA: Diagnosis not present

## 2017-08-20 DIAGNOSIS — R2681 Unsteadiness on feet: Secondary | ICD-10-CM | POA: Diagnosis not present

## 2017-08-20 DIAGNOSIS — M79602 Pain in left arm: Secondary | ICD-10-CM | POA: Diagnosis not present

## 2017-08-20 DIAGNOSIS — M6281 Muscle weakness (generalized): Secondary | ICD-10-CM | POA: Diagnosis not present

## 2017-08-20 DIAGNOSIS — R488 Other symbolic dysfunctions: Secondary | ICD-10-CM | POA: Diagnosis not present

## 2017-08-21 DIAGNOSIS — R2681 Unsteadiness on feet: Secondary | ICD-10-CM | POA: Diagnosis not present

## 2017-08-21 DIAGNOSIS — M6281 Muscle weakness (generalized): Secondary | ICD-10-CM | POA: Diagnosis not present

## 2017-08-21 DIAGNOSIS — R488 Other symbolic dysfunctions: Secondary | ICD-10-CM | POA: Diagnosis not present

## 2017-08-21 DIAGNOSIS — M79602 Pain in left arm: Secondary | ICD-10-CM | POA: Diagnosis not present

## 2017-08-23 DIAGNOSIS — R2681 Unsteadiness on feet: Secondary | ICD-10-CM | POA: Diagnosis not present

## 2017-08-23 DIAGNOSIS — M79602 Pain in left arm: Secondary | ICD-10-CM | POA: Diagnosis not present

## 2017-08-23 DIAGNOSIS — R488 Other symbolic dysfunctions: Secondary | ICD-10-CM | POA: Diagnosis not present

## 2017-08-23 DIAGNOSIS — M6281 Muscle weakness (generalized): Secondary | ICD-10-CM | POA: Diagnosis not present

## 2017-08-26 DIAGNOSIS — N3946 Mixed incontinence: Secondary | ICD-10-CM | POA: Diagnosis not present

## 2017-08-26 DIAGNOSIS — R2681 Unsteadiness on feet: Secondary | ICD-10-CM | POA: Diagnosis not present

## 2017-08-26 DIAGNOSIS — M79602 Pain in left arm: Secondary | ICD-10-CM | POA: Diagnosis not present

## 2017-08-26 DIAGNOSIS — M25512 Pain in left shoulder: Secondary | ICD-10-CM | POA: Diagnosis not present

## 2017-08-26 DIAGNOSIS — M6281 Muscle weakness (generalized): Secondary | ICD-10-CM | POA: Diagnosis not present

## 2017-08-26 DIAGNOSIS — R488 Other symbolic dysfunctions: Secondary | ICD-10-CM | POA: Diagnosis not present

## 2017-08-27 DIAGNOSIS — R2681 Unsteadiness on feet: Secondary | ICD-10-CM | POA: Diagnosis not present

## 2017-08-27 DIAGNOSIS — M25512 Pain in left shoulder: Secondary | ICD-10-CM | POA: Diagnosis not present

## 2017-08-27 DIAGNOSIS — M6281 Muscle weakness (generalized): Secondary | ICD-10-CM | POA: Diagnosis not present

## 2017-08-27 DIAGNOSIS — R488 Other symbolic dysfunctions: Secondary | ICD-10-CM | POA: Diagnosis not present

## 2017-08-27 DIAGNOSIS — M79602 Pain in left arm: Secondary | ICD-10-CM | POA: Diagnosis not present

## 2017-08-27 DIAGNOSIS — N3946 Mixed incontinence: Secondary | ICD-10-CM | POA: Diagnosis not present

## 2017-08-28 DIAGNOSIS — R2681 Unsteadiness on feet: Secondary | ICD-10-CM | POA: Diagnosis not present

## 2017-08-28 DIAGNOSIS — N3946 Mixed incontinence: Secondary | ICD-10-CM | POA: Diagnosis not present

## 2017-08-28 DIAGNOSIS — M6281 Muscle weakness (generalized): Secondary | ICD-10-CM | POA: Diagnosis not present

## 2017-08-28 DIAGNOSIS — M25512 Pain in left shoulder: Secondary | ICD-10-CM | POA: Diagnosis not present

## 2017-08-28 DIAGNOSIS — M79602 Pain in left arm: Secondary | ICD-10-CM | POA: Diagnosis not present

## 2017-08-28 DIAGNOSIS — R488 Other symbolic dysfunctions: Secondary | ICD-10-CM | POA: Diagnosis not present

## 2017-08-29 DIAGNOSIS — M79602 Pain in left arm: Secondary | ICD-10-CM | POA: Diagnosis not present

## 2017-08-29 DIAGNOSIS — R488 Other symbolic dysfunctions: Secondary | ICD-10-CM | POA: Diagnosis not present

## 2017-08-29 DIAGNOSIS — M25512 Pain in left shoulder: Secondary | ICD-10-CM | POA: Diagnosis not present

## 2017-08-29 DIAGNOSIS — R2681 Unsteadiness on feet: Secondary | ICD-10-CM | POA: Diagnosis not present

## 2017-08-29 DIAGNOSIS — M6281 Muscle weakness (generalized): Secondary | ICD-10-CM | POA: Diagnosis not present

## 2017-08-29 DIAGNOSIS — N3946 Mixed incontinence: Secondary | ICD-10-CM | POA: Diagnosis not present

## 2017-08-30 DIAGNOSIS — M79602 Pain in left arm: Secondary | ICD-10-CM | POA: Diagnosis not present

## 2017-08-30 DIAGNOSIS — M6281 Muscle weakness (generalized): Secondary | ICD-10-CM | POA: Diagnosis not present

## 2017-08-30 DIAGNOSIS — N3946 Mixed incontinence: Secondary | ICD-10-CM | POA: Diagnosis not present

## 2017-08-30 DIAGNOSIS — M25512 Pain in left shoulder: Secondary | ICD-10-CM | POA: Diagnosis not present

## 2017-08-30 DIAGNOSIS — R2681 Unsteadiness on feet: Secondary | ICD-10-CM | POA: Diagnosis not present

## 2017-08-30 DIAGNOSIS — R488 Other symbolic dysfunctions: Secondary | ICD-10-CM | POA: Diagnosis not present

## 2017-09-04 DIAGNOSIS — R488 Other symbolic dysfunctions: Secondary | ICD-10-CM | POA: Diagnosis not present

## 2017-09-04 DIAGNOSIS — N3946 Mixed incontinence: Secondary | ICD-10-CM | POA: Diagnosis not present

## 2017-09-04 DIAGNOSIS — R2681 Unsteadiness on feet: Secondary | ICD-10-CM | POA: Diagnosis not present

## 2017-09-04 DIAGNOSIS — M79602 Pain in left arm: Secondary | ICD-10-CM | POA: Diagnosis not present

## 2017-09-04 DIAGNOSIS — M6281 Muscle weakness (generalized): Secondary | ICD-10-CM | POA: Diagnosis not present

## 2017-09-04 DIAGNOSIS — M25512 Pain in left shoulder: Secondary | ICD-10-CM | POA: Diagnosis not present

## 2017-09-05 DIAGNOSIS — R2681 Unsteadiness on feet: Secondary | ICD-10-CM | POA: Diagnosis not present

## 2017-09-05 DIAGNOSIS — N3946 Mixed incontinence: Secondary | ICD-10-CM | POA: Diagnosis not present

## 2017-09-05 DIAGNOSIS — M25512 Pain in left shoulder: Secondary | ICD-10-CM | POA: Diagnosis not present

## 2017-09-05 DIAGNOSIS — Z7901 Long term (current) use of anticoagulants: Secondary | ICD-10-CM | POA: Diagnosis not present

## 2017-09-05 DIAGNOSIS — M6281 Muscle weakness (generalized): Secondary | ICD-10-CM | POA: Diagnosis not present

## 2017-09-05 DIAGNOSIS — R488 Other symbolic dysfunctions: Secondary | ICD-10-CM | POA: Diagnosis not present

## 2017-09-05 DIAGNOSIS — M79602 Pain in left arm: Secondary | ICD-10-CM | POA: Diagnosis not present

## 2017-09-16 DIAGNOSIS — M79602 Pain in left arm: Secondary | ICD-10-CM | POA: Diagnosis not present

## 2017-09-16 DIAGNOSIS — R2681 Unsteadiness on feet: Secondary | ICD-10-CM | POA: Diagnosis not present

## 2017-09-16 DIAGNOSIS — M6281 Muscle weakness (generalized): Secondary | ICD-10-CM | POA: Diagnosis not present

## 2017-09-16 DIAGNOSIS — R488 Other symbolic dysfunctions: Secondary | ICD-10-CM | POA: Diagnosis not present

## 2017-09-16 DIAGNOSIS — N3946 Mixed incontinence: Secondary | ICD-10-CM | POA: Diagnosis not present

## 2017-09-16 DIAGNOSIS — M25512 Pain in left shoulder: Secondary | ICD-10-CM | POA: Diagnosis not present

## 2017-09-18 DIAGNOSIS — R488 Other symbolic dysfunctions: Secondary | ICD-10-CM | POA: Diagnosis not present

## 2017-09-18 DIAGNOSIS — M25512 Pain in left shoulder: Secondary | ICD-10-CM | POA: Diagnosis not present

## 2017-09-18 DIAGNOSIS — R2681 Unsteadiness on feet: Secondary | ICD-10-CM | POA: Diagnosis not present

## 2017-09-18 DIAGNOSIS — N3946 Mixed incontinence: Secondary | ICD-10-CM | POA: Diagnosis not present

## 2017-09-18 DIAGNOSIS — M79602 Pain in left arm: Secondary | ICD-10-CM | POA: Diagnosis not present

## 2017-09-18 DIAGNOSIS — M6281 Muscle weakness (generalized): Secondary | ICD-10-CM | POA: Diagnosis not present

## 2017-09-20 DIAGNOSIS — R2681 Unsteadiness on feet: Secondary | ICD-10-CM | POA: Diagnosis not present

## 2017-09-20 DIAGNOSIS — M25512 Pain in left shoulder: Secondary | ICD-10-CM | POA: Diagnosis not present

## 2017-09-20 DIAGNOSIS — M6281 Muscle weakness (generalized): Secondary | ICD-10-CM | POA: Diagnosis not present

## 2017-09-20 DIAGNOSIS — N3946 Mixed incontinence: Secondary | ICD-10-CM | POA: Diagnosis not present

## 2017-09-20 DIAGNOSIS — R488 Other symbolic dysfunctions: Secondary | ICD-10-CM | POA: Diagnosis not present

## 2017-09-20 DIAGNOSIS — M79602 Pain in left arm: Secondary | ICD-10-CM | POA: Diagnosis not present

## 2017-09-23 DIAGNOSIS — G4733 Obstructive sleep apnea (adult) (pediatric): Secondary | ICD-10-CM | POA: Diagnosis not present

## 2017-10-02 DIAGNOSIS — Z7901 Long term (current) use of anticoagulants: Secondary | ICD-10-CM | POA: Diagnosis not present

## 2017-10-02 DIAGNOSIS — J069 Acute upper respiratory infection, unspecified: Secondary | ICD-10-CM | POA: Diagnosis not present

## 2017-10-15 ENCOUNTER — Encounter (HOSPITAL_BASED_OUTPATIENT_CLINIC_OR_DEPARTMENT_OTHER): Payer: Self-pay | Admitting: *Deleted

## 2017-10-15 ENCOUNTER — Other Ambulatory Visit: Payer: Self-pay

## 2017-10-15 ENCOUNTER — Emergency Department (HOSPITAL_BASED_OUTPATIENT_CLINIC_OR_DEPARTMENT_OTHER)
Admission: EM | Admit: 2017-10-15 | Discharge: 2017-10-15 | Disposition: A | Discharge status: Home / Self Care | Payer: Medicare Other | Attending: Emergency Medicine | Admitting: Emergency Medicine

## 2017-10-15 DIAGNOSIS — F0390 Unspecified dementia without behavioral disturbance: Secondary | ICD-10-CM | POA: Diagnosis not present

## 2017-10-15 DIAGNOSIS — N814 Uterovaginal prolapse, unspecified: Secondary | ICD-10-CM

## 2017-10-15 DIAGNOSIS — N8110 Cystocele, unspecified: Secondary | ICD-10-CM | POA: Insufficient documentation

## 2017-10-15 DIAGNOSIS — Z7901 Long term (current) use of anticoagulants: Secondary | ICD-10-CM | POA: Diagnosis not present

## 2017-10-15 DIAGNOSIS — I509 Heart failure, unspecified: Secondary | ICD-10-CM | POA: Diagnosis not present

## 2017-10-15 DIAGNOSIS — R448 Other symptoms and signs involving general sensations and perceptions: Secondary | ICD-10-CM | POA: Diagnosis present

## 2017-10-15 DIAGNOSIS — I110 Hypertensive heart disease with heart failure: Secondary | ICD-10-CM | POA: Diagnosis not present

## 2017-10-15 DIAGNOSIS — Z79899 Other long term (current) drug therapy: Secondary | ICD-10-CM | POA: Insufficient documentation

## 2017-10-15 HISTORY — DX: Heart failure, unspecified: I50.9

## 2017-10-15 NOTE — ED Triage Notes (Signed)
Pt. Daughter is a Marine scientist and reports that she has noticed her vaginal wall is abnormal at this time.  Pt. Has had issues in past and the daughter feels the Pt. Is in need of medical attention due to poss. Infection process.  Pt. Is alerts and oriented to self and daughter but disoriented to other things.  Pt. Walks without difficulty.

## 2017-10-15 NOTE — ED Provider Notes (Signed)
[facility] EMERGENCY DEPARTMENT Provider Note   CSN: [id number] Arrival date & time: 10/15/17  1417     History   Chief Complaint Chief Complaint  Patient presents with  . Foreign Body    in vagina    HPI Melissa Winters is a 84 y.o. female.  Chief complaint: Cystocele,  Is pessary still inside?  HPI: 84 year old female.  Here with her daughter.  Has history of cystocele.  Patient, and husband have both developed cognitive issues.  They moved to [place] to be near her daughter recently.  They have had trouble trying to remember the pessary is in the vagina or not.  Breakdown of the vaginal wall with a cystocele.  They are concerned the pessary may still be inside.  No drainage no bleeding.  No odor.    Past Medical History:  Diagnosis Date  . Atrial fibrillation ([place])   . CHF (congestive heart failure) ([place])   . Dementia   . Hypercholesterolemia   . Hypertension   . Mitral regurgitation 2015   MILD ECHO  . Osteoporosis   . Sleep apnea with use of continuous positive airway pressure (CPAP)     There are no active problems to display for this patient.    OB History    No data available       Home Medications    Prior to Admission medications   Medication Sig Start Date End Date Taking? Authorizing Provider  diltiazem (TIAZAC) 360 MG 24 hr capsule Take 360 mg by mouth daily.    [provider]  furosemide (LASIX) 20 MG tablet Take 10 mg by mouth daily as needed.    [provider]  hydrochlorothiazide (HYDRODIURIL) 25 MG tablet Take 25 mg by mouth every morning.    [provider]  metoprolol succinate (TOPROL-XL) 100 MG 24 hr tablet Take 100 mg by mouth daily. Take with or immediately following a meal.    [provider]  potassium chloride (K-DUR) 10 MEQ tablet Take 10 mEq by mouth as needed.     [provider]  warfarin (COUMADIN) 5 MG tablet Take 5 mg by mouth daily.    [provider]    Family History Family History  Problem Relation Age of Onset  . Breast cancer Mother     Social History Social History   Tobacco Use  . Smoking status: Never Smoker  . Smokeless tobacco: Never Used  Substance Use Topics  . Alcohol use: No    Frequency: Never  . Drug use: No     Allergies   Patient has no known allergies.   Review of Systems Review of Systems  Constitutional: Negative for appetite change, chills, diaphoresis, fatigue and fever.  HENT: Negative for mouth sores, sore throat and trouble swallowing.   Eyes: Negative for visual disturbance.  Respiratory: Negative for cough, chest tightness, shortness of breath and wheezing.   Cardiovascular: Negative for chest pain.  Gastrointestinal: Negative for abdominal distention, abdominal pain, diarrhea, nausea and vomiting.  Endocrine: Negative for polydipsia, polyphagia and polyuria.  Genitourinary: Negative for dysuria, frequency and hematuria.       Cystocele  Musculoskeletal: Negative for gait problem.  Skin: Negative for color change, pallor and rash.  Neurological: Negative for dizziness, syncope, light-headedness and headaches.  Hematological: Does not bruise/bleed easily.  Psychiatric/Behavioral: Negative for behavioral problems and confusion.     Physical Exam Updated Vital Signs BP 124/77 (BP Location: Left Arm)   Pulse 80  Temp 97.6 F (36.4 C) (Oral)   Resp 18   Ht 5\' 6"  (1.676 m)   Wt 73.9 kg (163 lb)   SpO2 96%   BMI 26.31 kg/m   Physical Exam  Constitutional: She is oriented to person, place, and time. She appears well-developed and well-nourished. No distress.  HENT:  Head: Normocephalic.  Eyes: Conjunctivae are normal. Pupils are equal, round, and reactive to light. No scleral icterus.  Neck: Normal range of motion. Neck supple. No thyromegaly present.  Cardiovascular: Normal rate and regular rhythm. Exam reveals no gallop and no friction rub.  No murmur  heard. Pulmonary/Chest: Effort normal and breath sounds normal. No respiratory distress. She has no wheezes. She has no rales.  Abdominal: Soft. Bowel sounds are normal. She exhibits no distension. There is no tenderness. There is no rebound.  Genitourinary:  Genitourinary Comments: Seal.  Stretch type injury with superficial breakdown of vaginal wall superiorly.  Reduces easily.  No palpable pessary.  Speculum exam the posterior vaginal wall was located.  No foreign body or pessary noted.  No discharge.  No bleeding.  No odor.  Musculoskeletal: Normal range of motion.  Neurological: She is alert and oriented to person, place, and time.  Skin: Skin is warm and dry. No rash noted.  Psychiatric: She has a normal mood and affect. Her behavior is normal.     ED Treatments / Results  Labs (all labs ordered are listed, but only abnormal results are displayed) Labs Reviewed - No data to display  EKG  EKG Interpretation None       Radiology No results found.  Procedures Procedures (including critical care time)  Medications Ordered in ED Medications - No data to display   Initial Impression / Assessment and Plan / ED Course  I have reviewed the triage vital signs and the nursing notes.  Pertinent labs & imaging results that were available during my care of the patient were reviewed by me and considered in my medical decision making (see chart for details).    States they will attempt to find a pessary at home.  Is a tampon to keep the cystocele reduced.  GYN follow-up.  Final Clinical Impressions(s) / ED Diagnoses   Final diagnoses:  Cystocele with prolapse    ED Discharge Orders    None       Tanna Furry, MD 10/15/17 1555

## 2017-10-15 NOTE — Discharge Instructions (Signed)
Use tampon temporarily to prevent prolapse of cystocele.  Use pessary if found. Follow up with GYN for re-exam to ensure that stretch Melissa Winters is improving. Call [facility] for appointment.

## 2017-11-01 DIAGNOSIS — I482 Chronic atrial fibrillation: Secondary | ICD-10-CM | POA: Diagnosis not present

## 2017-11-01 DIAGNOSIS — N8110 Cystocele, unspecified: Secondary | ICD-10-CM | POA: Diagnosis not present

## 2017-11-01 DIAGNOSIS — Z7901 Long term (current) use of anticoagulants: Secondary | ICD-10-CM | POA: Diagnosis not present

## 2017-11-19 DIAGNOSIS — Z0000 Encounter for general adult medical examination without abnormal findings: Secondary | ICD-10-CM | POA: Diagnosis not present

## 2017-11-19 DIAGNOSIS — I129 Hypertensive chronic kidney disease with stage 1 through stage 4 chronic kidney disease, or unspecified chronic kidney disease: Secondary | ICD-10-CM | POA: Diagnosis not present

## 2017-11-19 DIAGNOSIS — H6121 Impacted cerumen, right ear: Secondary | ICD-10-CM | POA: Diagnosis not present

## 2017-11-19 DIAGNOSIS — I482 Chronic atrial fibrillation: Secondary | ICD-10-CM | POA: Diagnosis not present

## 2017-11-19 DIAGNOSIS — G4733 Obstructive sleep apnea (adult) (pediatric): Secondary | ICD-10-CM | POA: Diagnosis not present

## 2017-11-19 DIAGNOSIS — Z23 Encounter for immunization: Secondary | ICD-10-CM | POA: Diagnosis not present

## 2017-11-19 DIAGNOSIS — Z1389 Encounter for screening for other disorder: Secondary | ICD-10-CM | POA: Diagnosis not present

## 2017-11-19 DIAGNOSIS — N183 Chronic kidney disease, stage 3 (moderate): Secondary | ICD-10-CM | POA: Diagnosis not present

## 2017-11-19 DIAGNOSIS — Z79899 Other long term (current) drug therapy: Secondary | ICD-10-CM | POA: Diagnosis not present

## 2017-11-27 DIAGNOSIS — G4733 Obstructive sleep apnea (adult) (pediatric): Secondary | ICD-10-CM | POA: Diagnosis not present

## 2017-12-03 DIAGNOSIS — H6121 Impacted cerumen, right ear: Secondary | ICD-10-CM | POA: Diagnosis not present

## 2018-06-26 DIAGNOSIS — Z23 Encounter for immunization: Secondary | ICD-10-CM | POA: Diagnosis not present

## 2018-07-25 DIAGNOSIS — Z1283 Encounter for screening for malignant neoplasm of skin: Secondary | ICD-10-CM | POA: Diagnosis not present

## 2018-07-25 DIAGNOSIS — D225 Melanocytic nevi of trunk: Secondary | ICD-10-CM | POA: Diagnosis not present

## 2018-07-25 DIAGNOSIS — D1801 Hemangioma of skin and subcutaneous tissue: Secondary | ICD-10-CM | POA: Diagnosis not present

## 2018-07-25 DIAGNOSIS — C44319 Basal cell carcinoma of skin of other parts of face: Secondary | ICD-10-CM | POA: Diagnosis not present

## 2018-07-25 DIAGNOSIS — C4401 Basal cell carcinoma of skin of lip: Secondary | ICD-10-CM | POA: Diagnosis not present

## 2018-11-19 DIAGNOSIS — H6123 Impacted cerumen, bilateral: Secondary | ICD-10-CM | POA: Diagnosis not present

## 2018-12-01 DIAGNOSIS — G4733 Obstructive sleep apnea (adult) (pediatric): Secondary | ICD-10-CM | POA: Diagnosis not present

## 2019-02-16 ENCOUNTER — Encounter (HOSPITAL_BASED_OUTPATIENT_CLINIC_OR_DEPARTMENT_OTHER): Payer: Self-pay

## 2019-02-16 ENCOUNTER — Telehealth (HOSPITAL_BASED_OUTPATIENT_CLINIC_OR_DEPARTMENT_OTHER): Payer: Self-pay | Admitting: Emergency Medicine

## 2019-02-16 ENCOUNTER — Emergency Department (HOSPITAL_BASED_OUTPATIENT_CLINIC_OR_DEPARTMENT_OTHER)
Admission: EM | Admit: 2019-02-16 | Discharge: 2019-02-16 | Disposition: A | Discharge status: Home / Self Care | Payer: Medicare Other | Attending: Emergency Medicine | Admitting: Emergency Medicine

## 2019-02-16 ENCOUNTER — Other Ambulatory Visit: Payer: Self-pay

## 2019-02-16 DIAGNOSIS — T465X1A Poisoning by other antihypertensive drugs, accidental (unintentional), initial encounter: Secondary | ICD-10-CM | POA: Diagnosis not present

## 2019-02-16 DIAGNOSIS — I509 Heart failure, unspecified: Secondary | ICD-10-CM | POA: Diagnosis not present

## 2019-02-16 DIAGNOSIS — I110 Hypertensive heart disease with heart failure: Secondary | ICD-10-CM | POA: Diagnosis not present

## 2019-02-16 DIAGNOSIS — T50901A Poisoning by unspecified drugs, medicaments and biological substances, accidental (unintentional), initial encounter: Secondary | ICD-10-CM | POA: Insufficient documentation

## 2019-02-16 DIAGNOSIS — F0390 Unspecified dementia without behavioral disturbance: Secondary | ICD-10-CM | POA: Insufficient documentation

## 2019-02-16 DIAGNOSIS — I4891 Unspecified atrial fibrillation: Secondary | ICD-10-CM | POA: Diagnosis not present

## 2019-02-16 LAB — CBC WITH DIFFERENTIAL/PLATELET
Abs Immature Granulocytes: 0.02 10*3/uL (ref 0.00–0.07)
Basophils Absolute: 0.1 10*3/uL (ref 0.0–0.1)
Basophils Relative: 1 %
Eosinophils Absolute: 0.3 10*3/uL (ref 0.0–0.5)
Eosinophils Relative: 4 %
HCT: 40.7 % (ref 36.0–46.0)
Hemoglobin: 13.5 g/dL (ref 12.0–15.0)
Immature Granulocytes: 0 %
Lymphocytes Relative: 33 %
Lymphs Abs: 2.5 10*3/uL (ref 0.7–4.0)
MCH: 31.7 pg (ref 26.0–34.0)
MCHC: 33.2 g/dL (ref 30.0–36.0)
MCV: 95.5 fL (ref 80.0–100.0)
Monocytes Absolute: 1 10*3/uL (ref 0.1–1.0)
Monocytes Relative: 13 %
Neutro Abs: 3.7 10*3/uL (ref 1.7–7.7)
Neutrophils Relative %: 49 %
Platelets: 155 10*3/uL (ref 150–400)
RBC: 4.26 MIL/uL (ref 3.87–5.11)
RDW: 13.6 % (ref 11.5–15.5)
WBC: 7.5 10*3/uL (ref 4.0–10.5)
nRBC: 0 % (ref 0.0–0.2)

## 2019-02-16 LAB — PROTIME-INR
INR: 3.7 — ABNORMAL HIGH (ref 0.8–1.2)
Prothrombin Time: 36.1 seconds — ABNORMAL HIGH (ref 11.4–15.2)

## 2019-02-16 LAB — BASIC METABOLIC PANEL
Anion gap: 10 (ref 5–15)
BUN: 35 mg/dL — ABNORMAL HIGH (ref 8–23)
CO2: 25 mmol/L (ref 22–32)
Calcium: 9.4 mg/dL (ref 8.9–10.3)
Chloride: 96 mmol/L — ABNORMAL LOW (ref 98–111)
Creatinine, Ser: 1.3 mg/dL — ABNORMAL HIGH (ref 0.44–1.00)
GFR calc Af Amer: 42 mL/min — ABNORMAL LOW (ref >60)
GFR calc non Af Amer: 36 mL/min — ABNORMAL LOW (ref >60)
Glucose, Bld: 107 mg/dL — ABNORMAL HIGH (ref 70–99)
Potassium: 3.4 mmol/L — ABNORMAL LOW (ref 3.5–5.1)
Sodium: 131 mmol/L — ABNORMAL LOW (ref 135–145)

## 2019-02-16 NOTE — ED Notes (Signed)
ED Provider at bedside. 

## 2019-02-16 NOTE — ED Notes (Signed)
RN attempted twice for IV and was unsuccessful

## 2019-02-16 NOTE — ED Triage Notes (Addendum)
Pt and daughter states that pt took 930am and unintentional dose by caretaker at 1230pm of cardizem, coumadin and metoprolol-daughter states pt's HR and BP were low at home-pt NAD-to triage in w/c

## 2019-02-16 NOTE — ED Provider Notes (Signed)
Emergency Department Provider Note   I have reviewed the triage vital signs and the nursing notes.   HISTORY  Chief Complaint Medication Problem   HPI Melissa Winters is a 85 y.o. female with PHM of a-fib, CHD, dementia, HTN, and OSA presents to the emergency department for evaluation after an intentional overdose.  Patient's daughter is at bedside to assist with history.  Patient took her morning dose of Cardizem 360 mg 24hr, Metoprolol 100 mg 24hr, and Coumadin 5 mg tab at 9:30 AM.  The daughter left a note she had already given these medications but the afternoon caregiver gave an additional dose of these 3 medications at 12:30 PM.  The daughter realized what happened and began assessing the patient's vital signs.  She reports pulse 46 repeatedly and blood pressure as low as 80s.  Patient denies any symptom such as lightheadedness, shortness of breath, heart palpitations, weakness.  The daughter states that she called poison control and was advised to present to the emergency department for evaluation and monitoring.  Level 5 caveat: Dementia    Past Medical History:  Diagnosis Date  . Atrial fibrillation ([place])   . CHF (congestive heart failure) ([place])   . Dementia ([place])   . Hypercholesterolemia   . Hypertension   . Mitral regurgitation 2015   MILD ECHO  . Osteoporosis   . Sleep apnea with use of continuous positive airway pressure (CPAP)     There are no active problems to display for this patient.   History reviewed. No pertinent surgical history.  Allergies Patient has no known allergies.  Family History  Problem Relation Age of Onset  . Breast cancer Mother     Social History Social History   Tobacco Use  . Smoking status: Never Smoker  . Smokeless tobacco: Never Used  Substance Use Topics  . Alcohol use: No    Frequency: Never  . Drug use: No    Review of Systems  Constitutional: No fever/chills. Eyes: No visual changes. ENT: No sore  throat. Cardiovascular: Denies chest pain. Respiratory: Denies shortness of breath. Gastrointestinal: No abdominal pain.   Skin: Negative for rash. Neurological: Negative for drowsiness.   10-point ROS otherwise negative.  ____________________________________________   PHYSICAL EXAM:  VITAL SIGNS: ED Triage Vitals  Enc Vitals Group     BP 02/16/19 2020 120/60     Pulse Rate 02/16/19 2020 62     Resp 02/16/19 2020 20     Temp 02/16/19 2020 97.9 F (36.6 C)     Temp Source 02/16/19 2020 Oral     SpO2 02/16/19 2020 98 %     Weight 02/16/19 2020 160 lb (72.6 kg)     Height 02/16/19 2020 5\' 6"  (1.676 m)   Constitutional: Alert. Conversational. Well appearing and in no acute distress. Eyes: Conjunctivae are normal. Head: Atraumatic. Nose: No congestion/rhinnorhea. Mouth/Throat: Mucous membranes are moist.  Oropharynx non-erythematous. Neck: No stridor.  Cardiovascular: A-fib. Good peripheral circulation. Grossly normal heart sounds.   Respiratory: Normal respiratory effort.  No retractions. Lungs CTAB. Gastrointestinal: No distention.  Musculoskeletal: No lower extremity tenderness nor edema. No gross deformities of extremities. Neurologic:  Normal speech and language.  Skin:  Skin is warm, dry and intact. No rash noted.  ____________________________________________   LABS (all labs ordered are listed, but only abnormal results are displayed)  Labs Reviewed  BASIC METABOLIC PANEL - Abnormal; Notable for the following components:      Result Value   Sodium 131 (*)  Potassium 3.4 (*)    Chloride 96 (*)    Glucose, Bld 107 (*)    BUN 35 (*)    Creatinine, Ser 1.30 (*)    GFR calc non Af Amer 36 (*)    GFR calc Af Amer 42 (*)    All other components within normal limits  PROTIME-INR - Abnormal; Notable for the following components:   Prothrombin Time 36.1 (*)    INR 3.7 (*)    All other components within normal limits  CBC WITH DIFFERENTIAL/PLATELET    ____________________________________________  EKG   EKG Interpretation  Date/Time:  Monday Feb 16 2019 20:43:50 EDT Ventricular Rate:  52 PR Interval:    QRS Duration: 89 QT Interval:  484 QTC Calculation: 451 R Axis:   -48 Text Interpretation:  Atrial fibrillation Left anterior fascicular block No STEMI. No prior for comparison.  Confirmed by Nanda Quinton [phone]) on 02/16/2019 9:05:10 PM       ____________________________________________  RADIOLOGY  None  ____________________________________________   PROCEDURES  Procedure(s) performed:   Procedures  None  ____________________________________________   INITIAL IMPRESSION / ASSESSMENT AND PLAN / ED COURSE  Pertinent labs & imaging results that were available during my care of the patient were reviewed by me and considered in my medical decision making (see chart for details).   Patient presents to the emergency department for evaluation of unintentional overdose on metoprolol, Cardizem, Coumadin.  Spoke with [facility] control who recommend 8-hour observation from the time of last ingestion.  This would be the time that the patient arrived to the emergency department.  Plan for screening labs, EKG, reassess after brief observation period in the emergency department to be sure that vital signs are remaining stable.  No further intervention at this time.   Lab work, EKG reviewed.  Vital signs remained stable here in the emergency department.  Remains awake and alert.  Ambulatory here without symptoms.  INR is 3.7.  Discussed with daughter that she will need this to be rechecked and to follow-up with the primary care physician.  Discussed ED return precautions.  Questions answered at discharge.  Family pleased with discharge plan. ____________________________________________  FINAL CLINICAL IMPRESSION(S) / ED DIAGNOSES  Final diagnoses:  Accidental drug overdose, initial encounter   Note:  This document was  prepared using Dragon voice recognition software and may include unintentional dictation errors.  Nanda Quinton, MD Emergency Medicine    Graclynn Vanantwerp, [facility], MD 02/16/19 2155

## 2019-02-16 NOTE — Discharge Instructions (Signed)
You were seen today after an accidental overdose on prescription medications.  Your INR will need to be followed by your PCP.  Return to the emergency department with any lightheadedness, shortness of breath, confusion, or other concerning symptoms.

## 2019-02-16 NOTE — ED Notes (Signed)
Pt and family member understood dc material. NAD noted. All questions answered to satisfaction. Pt and family member escorted to check out window.

## 2019-02-19 DIAGNOSIS — G4733 Obstructive sleep apnea (adult) (pediatric): Secondary | ICD-10-CM | POA: Diagnosis not present

## 2019-02-19 DIAGNOSIS — Z0000 Encounter for general adult medical examination without abnormal findings: Secondary | ICD-10-CM | POA: Diagnosis not present

## 2019-02-19 DIAGNOSIS — N183 Chronic kidney disease, stage 3 (moderate): Secondary | ICD-10-CM | POA: Diagnosis not present

## 2019-02-19 DIAGNOSIS — I482 Chronic atrial fibrillation, unspecified: Secondary | ICD-10-CM | POA: Diagnosis not present

## 2019-02-19 DIAGNOSIS — I129 Hypertensive chronic kidney disease with stage 1 through stage 4 chronic kidney disease, or unspecified chronic kidney disease: Secondary | ICD-10-CM | POA: Diagnosis not present

## 2019-05-12 DIAGNOSIS — Z7901 Long term (current) use of anticoagulants: Secondary | ICD-10-CM | POA: Diagnosis not present

## 2019-05-29 DIAGNOSIS — Z79899 Other long term (current) drug therapy: Secondary | ICD-10-CM | POA: Diagnosis not present

## 2019-05-29 DIAGNOSIS — I129 Hypertensive chronic kidney disease with stage 1 through stage 4 chronic kidney disease, or unspecified chronic kidney disease: Secondary | ICD-10-CM | POA: Diagnosis not present

## 2019-05-29 DIAGNOSIS — N183 Chronic kidney disease, stage 3 (moderate): Secondary | ICD-10-CM | POA: Diagnosis not present

## 2019-05-29 DIAGNOSIS — I482 Chronic atrial fibrillation, unspecified: Secondary | ICD-10-CM | POA: Diagnosis not present

## 2019-06-26 DIAGNOSIS — Z23 Encounter for immunization: Secondary | ICD-10-CM | POA: Diagnosis not present

## 2019-07-21 ENCOUNTER — Emergency Department (HOSPITAL_BASED_OUTPATIENT_CLINIC_OR_DEPARTMENT_OTHER): Payer: Medicare Other

## 2019-07-21 ENCOUNTER — Emergency Department (HOSPITAL_BASED_OUTPATIENT_CLINIC_OR_DEPARTMENT_OTHER)
Admission: EM | Admit: 2019-07-21 | Discharge: 2019-07-21 | Disposition: A | Discharge status: Home / Self Care | Payer: Medicare Other | Attending: Emergency Medicine | Admitting: Emergency Medicine

## 2019-07-21 ENCOUNTER — Other Ambulatory Visit: Payer: Self-pay

## 2019-07-21 ENCOUNTER — Encounter (HOSPITAL_BASED_OUTPATIENT_CLINIC_OR_DEPARTMENT_OTHER): Payer: Self-pay | Admitting: *Deleted

## 2019-07-21 DIAGNOSIS — Z79899 Other long term (current) drug therapy: Secondary | ICD-10-CM | POA: Diagnosis not present

## 2019-07-21 DIAGNOSIS — K449 Diaphragmatic hernia without obstruction or gangrene: Secondary | ICD-10-CM | POA: Diagnosis not present

## 2019-07-21 DIAGNOSIS — N8110 Cystocele, unspecified: Secondary | ICD-10-CM

## 2019-07-21 DIAGNOSIS — I110 Hypertensive heart disease with heart failure: Secondary | ICD-10-CM | POA: Insufficient documentation

## 2019-07-21 DIAGNOSIS — M5442 Lumbago with sciatica, left side: Secondary | ICD-10-CM | POA: Insufficient documentation

## 2019-07-21 DIAGNOSIS — E782 Mixed hyperlipidemia: Secondary | ICD-10-CM | POA: Insufficient documentation

## 2019-07-21 DIAGNOSIS — I482 Chronic atrial fibrillation, unspecified: Secondary | ICD-10-CM | POA: Diagnosis not present

## 2019-07-21 DIAGNOSIS — N819 Female genital prolapse, unspecified: Secondary | ICD-10-CM | POA: Diagnosis not present

## 2019-07-21 DIAGNOSIS — N813 Complete uterovaginal prolapse: Secondary | ICD-10-CM | POA: Diagnosis not present

## 2019-07-21 DIAGNOSIS — I509 Heart failure, unspecified: Secondary | ICD-10-CM | POA: Diagnosis not present

## 2019-07-21 DIAGNOSIS — N3000 Acute cystitis without hematuria: Secondary | ICD-10-CM | POA: Diagnosis not present

## 2019-07-21 DIAGNOSIS — Z7901 Long term (current) use of anticoagulants: Secondary | ICD-10-CM | POA: Diagnosis not present

## 2019-07-21 DIAGNOSIS — M545 Low back pain: Secondary | ICD-10-CM | POA: Diagnosis present

## 2019-07-21 DIAGNOSIS — F0390 Unspecified dementia without behavioral disturbance: Secondary | ICD-10-CM | POA: Diagnosis not present

## 2019-07-21 LAB — CBC WITH DIFFERENTIAL/PLATELET
Abs Immature Granulocytes: 0.08 10*3/uL — ABNORMAL HIGH (ref 0.00–0.07)
Basophils Absolute: 0.1 10*3/uL (ref 0.0–0.1)
Basophils Relative: 1 %
Eosinophils Absolute: 0.2 10*3/uL (ref 0.0–0.5)
Eosinophils Relative: 2 %
HCT: 41.4 % (ref 36.0–46.0)
Hemoglobin: 13.6 g/dL (ref 12.0–15.0)
Immature Granulocytes: 1 %
Lymphocytes Relative: 18 %
Lymphs Abs: 1.9 10*3/uL (ref 0.7–4.0)
MCH: 32.5 pg (ref 26.0–34.0)
MCHC: 32.9 g/dL (ref 30.0–36.0)
MCV: 99 fL (ref 80.0–100.0)
Monocytes Absolute: 1 10*3/uL (ref 0.1–1.0)
Monocytes Relative: 9 %
Neutro Abs: 7 10*3/uL (ref 1.7–7.7)
Neutrophils Relative %: 69 %
Platelets: 176 10*3/uL (ref 150–400)
RBC: 4.18 MIL/uL (ref 3.87–5.11)
RDW: 13.2 % (ref 11.5–15.5)
WBC: 10.2 10*3/uL (ref 4.0–10.5)
nRBC: 0 % (ref 0.0–0.2)

## 2019-07-21 LAB — COMPREHENSIVE METABOLIC PANEL
ALT: 17 U/L (ref 0–44)
AST: 23 U/L (ref 15–41)
Albumin: 4.4 g/dL (ref 3.5–5.0)
Alkaline Phosphatase: 71 U/L (ref 38–126)
Anion gap: 11 (ref 5–15)
BUN: 24 mg/dL — ABNORMAL HIGH (ref 8–23)
CO2: 23 mmol/L (ref 22–32)
Calcium: 9.8 mg/dL (ref 8.9–10.3)
Chloride: 102 mmol/L (ref 98–111)
Creatinine, Ser: 1 mg/dL (ref 0.44–1.00)
GFR calc Af Amer: 57 mL/min — ABNORMAL LOW (ref >60)
GFR calc non Af Amer: 50 mL/min — ABNORMAL LOW (ref >60)
Glucose, Bld: 102 mg/dL — ABNORMAL HIGH (ref 70–99)
Potassium: 3.9 mmol/L (ref 3.5–5.1)
Sodium: 136 mmol/L (ref 135–145)
Total Bilirubin: 1 mg/dL (ref 0.3–1.2)
Total Protein: 7.3 g/dL (ref 6.5–8.1)

## 2019-07-21 LAB — URINALYSIS, ROUTINE W REFLEX MICROSCOPIC
Bilirubin Urine: NEGATIVE
Glucose, UA: NEGATIVE mg/dL
Hgb urine dipstick: NEGATIVE
Ketones, ur: NEGATIVE mg/dL
Nitrite: POSITIVE — AB
Protein, ur: NEGATIVE mg/dL
Specific Gravity, Urine: 1.01 (ref 1.005–1.030)
pH: 6 (ref 5.0–8.0)

## 2019-07-21 LAB — URINALYSIS, MICROSCOPIC (REFLEX)

## 2019-07-21 LAB — PROTIME-INR
INR: 2.8 — ABNORMAL HIGH (ref 0.8–1.2)
Prothrombin Time: 29.4 seconds — ABNORMAL HIGH (ref 11.4–15.2)

## 2019-07-21 MED ORDER — CEPHALEXIN 500 MG PO CAPS: 500.0000 mg | ORAL_CAPSULE | Freq: Four times a day (QID) | ORAL | 0 refills | Status: AC

## 2019-07-21 MED ORDER — FENTANYL CITRATE (PF) 100 MCG/2ML IJ SOLN
25.0000 ug | Freq: Once | INTRAMUSCULAR | Status: AC
  Administered 2019-07-21: 15:00:00 25 ug via INTRAVENOUS
  Filled 2019-07-21: qty 2

## 2019-07-21 MED ORDER — ONDANSETRON HCL 4 MG/2ML IJ SOLN
4.0000 mg | Freq: Once | INTRAMUSCULAR | Status: AC
  Administered 2019-07-21: 15:00:00 4 mg via INTRAVENOUS
  Filled 2019-07-21: qty 2

## 2019-07-21 MED ORDER — HYDROCODONE-ACETAMINOPHEN 5-325 MG PO TABS: 1.0000 | ORAL_TABLET | Freq: Four times a day (QID) | ORAL | 0 refills | Status: AC | PRN

## 2019-07-21 MED ORDER — IOHEXOL 300 MG/ML  SOLN
100.0000 mL | Freq: Once | INTRAMUSCULAR | Status: AC | PRN
  Administered 2019-07-21: 15:00:00 100 mL via INTRAVENOUS

## 2019-07-21 MED ORDER — SODIUM CHLORIDE 0.9 % IV SOLN
INTRAVENOUS | Status: DC
  Administered 2019-07-21: 15:00:00 via INTRAVENOUS

## 2019-07-21 MED ORDER — SODIUM CHLORIDE 0.9 % IV SOLN
1.0000 g | Freq: Once | INTRAVENOUS | Status: AC
  Administered 2019-07-21: 15:00:00 1 g via INTRAVENOUS
  Filled 2019-07-21: qty 10

## 2019-07-21 MED FILL — CEPHALEXIN 500 MG CAPSULE: 500 | 7 days supply | Qty: 28 | Fill #0

## 2019-07-21 MED FILL — HYDROCODON-APAP 5-325: 5-325 | 4 days supply | Qty: 14 | Fill #0

## 2019-07-21 NOTE — ED Notes (Signed)
Patient transported to CT 

## 2019-07-21 NOTE — ED Provider Notes (Signed)
Received care from Dr. Rogene Houston. Pt presented with back pin, found to have bladder prolapse and acute cystitis.  CT pending.  CT shows marked pelvic floor prolapse including bladder, rectum, portion of small bowel without obstruction.  Distention of urinary bladder, however no sign of hydronephrosis or renal impairment on labs.  Discussed strict return precautions, need for close GYN follow up.  Abx and medications written by Dr. Rogene Houston.    Gareth Morgan, MD 07/23/19 2123

## 2019-07-21 NOTE — ED Provider Notes (Addendum)
[facility] EMERGENCY DEPARTMENT Provider Note   CSN: [id number] Arrival date & time: 07/21/19  1337     History   Chief Complaint Chief Complaint  Patient presents with   Back Pain    HPI Melissa Winters is a 85 y.o. female.     Patient brought in by home from EMS for the complaint of back pain.  Is been ongoing for 3 days.  But worse today.  Made worse with movement of the legs.  Pain on the left side does radiate down into the left leg some.  Patient also with the feeling that she needs to constantly pee.  EMS gave patient 25 mcg of fentanyl.  No nausea or vomiting.  No fevers.  Patient's past medical history significant for atrial fibrillation she is on Coumadin for that congestive heart failure history of dementia hypertension Ronalee Belts for a valve regurgitation and high cholesterol.  No fall or injury.  No numbness or weakness to the lower extremities     Past Medical History:  Diagnosis Date   Atrial fibrillation ([facility])    CHF (congestive heart failure) ([facility])    Dementia ([facility])    Hypercholesterolemia    Hypertension    Mitral regurgitation 2015   MILD ECHO   Osteoporosis    Sleep apnea with use of continuous positive airway pressure (CPAP)     There are no active problems to display for this patient.   History reviewed. No pertinent surgical history.   OB History   No obstetric history on file.      Home Medications    Prior to Admission medications   Medication Sig Start Date End Date Taking? Authorizing Provider  cephALEXin (KEFLEX) 500 MG capsule Take 1 capsule (500 mg total) by mouth 4 (four) times daily. 07/21/19   Fredia Sorrow, MD  diltiazem (TIAZAC) 360 MG 24 hr capsule Take 360 mg by mouth daily.    [provider]  furosemide (LASIX) 20 MG tablet Take 10 mg by mouth daily as needed.    [provider]  hydrochlorothiazide (HYDRODIURIL) 25 MG tablet Take 25 mg by mouth every morning.    [provider]  HYDROcodone-acetaminophen (NORCO/VICODIN) 5-325 MG tablet Take 1 tablet by mouth every 6 (six) hours as needed for moderate pain. 07/21/19   Fredia Sorrow, MD  metoprolol succinate (TOPROL-XL) 100 MG 24 hr tablet Take 100 mg by mouth daily. Take with or immediately following a meal.    [provider]  potassium chloride (K-DUR) 10 MEQ tablet Take 10 mEq by mouth as needed.     [provider]  warfarin (COUMADIN) 5 MG tablet Take 5 mg by mouth daily.    [provider]    Family History Family History  Problem Relation Age of Onset   Breast cancer Mother     Social History Social History   Tobacco Use   Smoking status: Never Smoker   Smokeless tobacco: Never Used  Substance Use Topics   Alcohol use: No    Frequency: Never   Drug use: No     Allergies   Patient has no known allergies.   Review of Systems Review of Systems  Constitutional: Negative for chills and fever.  HENT: Negative for congestion, rhinorrhea and sore throat.   Eyes: Negative for visual disturbance.  Respiratory: Negative for cough and shortness of breath.   Cardiovascular: Negative for chest pain and leg swelling.  Gastrointestinal: Negative for abdominal pain, diarrhea, nausea and vomiting.  Genitourinary: Positive for dysuria and frequency.  Musculoskeletal: Positive for back pain. Negative for neck pain.  Skin: Negative for rash.  Neurological: Negative for dizziness, weakness, light-headedness, numbness and headaches.  Hematological: Does not bruise/bleed easily.  Psychiatric/Behavioral: Negative for confusion.     Physical Exam Updated Vital Signs BP (!) 130/91    Pulse 89    Temp 98.5 F (36.9 C)    Resp 16    Ht 1.689 m (5' 6.5")    Wt 68 kg    SpO2 97%    BMI 23.85 kg/m   Physical Exam Vitals signs and nursing note reviewed.  Constitutional:      General: She is not in acute distress.    Appearance: Normal appearance. She is  well-developed.  HENT:     Head: Normocephalic and atraumatic.  Eyes:     Extraocular Movements: Extraocular movements intact.     Conjunctiva/sclera: Conjunctivae normal.     Pupils: Pupils are equal, round, and reactive to light.  Neck:     Musculoskeletal: Normal range of motion and neck supple.  Cardiovascular:     Rate and Rhythm: Normal rate and regular rhythm.     Heart sounds: No murmur.  Pulmonary:     Effort: Pulmonary effort is normal. No respiratory distress.     Breath sounds: Normal breath sounds.  Abdominal:     Palpations: Abdomen is soft.     Tenderness: There is abdominal tenderness.     Comments: Some mild tenderness to palpation suprapubic area.  Genitourinary:    Comments: Patient with obvious prolapsed bladder. Musculoskeletal: Normal range of motion.        General: No swelling.     Comments: Patient's dorsalis pedis pulse 1+ bilaterally.  Good movement of toes no sensory deficit.  Skin:    General: Skin is warm and dry.     Capillary Refill: Capillary refill takes less than 2 seconds.  Neurological:     General: No focal deficit present.     Mental Status: She is alert and oriented to person, place, and time.     Cranial Nerves: No cranial nerve deficit.     Sensory: No sensory deficit.     Motor: No weakness.      ED Treatments / Results  Labs (all labs ordered are listed, but only abnormal results are displayed) Labs Reviewed  URINALYSIS, ROUTINE W REFLEX MICROSCOPIC - Abnormal; Notable for the following components:      Result Value   APPearance CLOUDY (*)    Nitrite POSITIVE (*)    Leukocytes,Ua SMALL (*)    All other components within normal limits  CBC WITH DIFFERENTIAL/PLATELET - Abnormal; Notable for the following components:   Abs Immature Granulocytes 0.08 (*)    All other components within normal limits  COMPREHENSIVE METABOLIC PANEL - Abnormal; Notable for the following components:   Glucose, Bld 102 (*)    BUN 24 (*)    GFR  calc non Af Amer 50 (*)    GFR calc Af Amer 57 (*)    All other components within normal limits  URINALYSIS, MICROSCOPIC (REFLEX) - Abnormal; Notable for the following components:   Bacteria, UA MANY (*)    All other components within normal limits  PROTIME-INR - Abnormal; Notable for the following components:   Prothrombin Time 29.4 (*)    INR 2.8 (*)    All other components within normal limits  URINE CULTURE    EKG EKG Interpretation  Date/Time:  Tuesday July 21 2019 15:15:26 EDT Ventricular Rate:  96 PR Interval:    QRS Duration: 93 QT Interval:  405 QTC Calculation: 488 R Axis:   -58 Text Interpretation: Atrial fibrillation Ventricular premature complex Left anterior fascicular block Consider anterior infarct Artifact Confirmed by Fredia Sorrow [phone]) on 07/21/2019 3:18:46 PM   Radiology No results found.  Procedures Procedures (including critical care time)  Medications Ordered in ED Medications  0.9 %  sodium chloride infusion ( Intravenous New Bag/Given 07/21/19 1514)  cefTRIAXone (ROCEPHIN) 1 g in sodium chloride 0.9 % 100 mL IVPB (1 g Intravenous New Bag/Given 07/21/19 1511)  ondansetron (ZOFRAN) injection 4 mg (4 mg Intravenous Given 07/21/19 1502)  fentaNYL (SUBLIMAZE) injection 25 mcg (25 mcg Intravenous Given 07/21/19 1502)  iohexol (OMNIPAQUE) 300 MG/ML solution 100 mL (100 mLs Intravenous Contrast Given 07/21/19 1451)     Initial Impression / Assessment and Plan / ED Course  I have reviewed the triage vital signs and the nursing notes.  Pertinent labs & imaging results that were available during my care of the patient were reviewed by me and considered in my medical decision making (see chart for details).        Patient symptoms highly suggestive of may be compression fracture lumbar area although no fall or injury.  Also urinalysis with positive nitrite suggestive of infection but this may have something to do with the prolapse.  Based on  this we will going do formal CT scan of abdomen and pelvis that will also look at the lumbar spine and also will look at the kidneys and the other organs in the area.  Patient's daughter states that they have known about the prolapsed bladder for a while that is not new.  Probably does explain why patient feels as if she needs to go to the bathroom often.  Have sent urine for culture.  Started patient on Rocephin.  Patient given some fentanyl for pain.  CT scan will take a good look at the lumbar area any other the pelvic organs situations her kidney situation.  And will determine disposition.  Patient's labs not febrile not tachycardic not hypotensive patient is not normally on oxygen.  On 2 L she was satting 99%.  So that was removed.  Patient oxygen sats on room air low 90s.  Renal function now without significant abnormality no significant leukocytosis.  Clinically patient may need pain control for low back pain.  And needs outpatient antibiotic Keflex for the questionable urinary tract infection.  In addition patient has EKG and INR results pending.  INR is back in its 2.6.  Which is therapeutic. EKG shows rate controlled atrial fibrillation.  No significant change from EKG back in May.  Final Clinical Impressions(s) / ED Diagnoses   Final diagnoses:  Acute bilateral low back pain with left-sided sciatica  Acute cystitis without hematuria  Bladder prolapse, female, acquired  Chronic atrial fibrillation [facility])    ED Discharge Orders         Ordered    cephALEXin (KEFLEX) 500 MG capsule  4 times daily     07/21/19 1510    HYDROcodone-acetaminophen (NORCO/VICODIN) 5-325 MG tablet  Every 6 hours PRN     07/21/19 1510           Fredia Sorrow, MD 07/21/19 1503    Fredia Sorrow, MD 07/21/19 1504    Fredia Sorrow, MD 07/21/19 1511    Fredia Sorrow, MD 07/21/19 1512    Fredia Sorrow, MD 07/21/19 1520

## 2019-07-21 NOTE — ED Notes (Signed)
Pt was able to stand beside bed and take 2 steps to wc w/o difficulty

## 2019-07-21 NOTE — ED Triage Notes (Addendum)
Brought in by EMS from home , c/o back pain x 3 days. PTA fentanyl 25 mcg at 1301

## 2019-07-21 NOTE — ED Notes (Signed)
Patient transported to X-ray 

## 2019-07-21 NOTE — Discharge Instructions (Addendum)
Take the antibiotic Keflex for the urinary tract infection as directed over the next 7 days.  Take the hydrocodone as needed for pain.  Make an appointment to follow-up with your doctor.  Recommend evaluation with GYN for the significant bladder prolapse.  Return for any new or worse symptoms.

## 2019-07-21 NOTE — ED Notes (Signed)
Pt at CT will do EKG when she returns

## 2019-07-23 LAB — URINE CULTURE: Culture: >=100000 — AB

## 2019-07-24 ENCOUNTER — Telehealth: Payer: Self-pay | Admitting: *Deleted

## 2019-07-24 DIAGNOSIS — S1093XA Contusion of unspecified part of neck, initial encounter: Secondary | ICD-10-CM | POA: Diagnosis not present

## 2019-07-24 NOTE — Telephone Encounter (Signed)
Post ED Visit - Positive Culture Follow-up  Culture report reviewed by antimicrobial stewardship pharmacist: [facility] []  Elenor Quinones, Pharm.D. []  Heide Guile, Pharm.D., BCPS AQ-ID []  Parks Neptune, Pharm.D., BCPS []  Alycia Rossetti, Pharm.D., BCPS []  [place], Pharm.D., BCPS, AAHIVP []  Legrand Como, Pharm.D., BCPS, AAHIVP []  Salome Arnt, PharmD, BCPS []  Johnnette Gourd, PharmD, BCPS []  Hughes Better, PharmD, BCPS []  Leeroy Cha, PharmD []  Laqueta Linden, PharmD, BCPS []  Albertina Parr, PharmD  [facility] []  Leodis Sias, PharmD []  Lindell Spar, PharmD []  Royetta Asal, PharmD []  Graylin Shiver, Rph []  Rema Fendt) Glennon Mac, PharmD []  Arlyn Dunning, PharmD []  Netta Cedars, PharmD []  Dia Sitter, PharmD []  Leone Haven, PharmD []  Gretta Arab, PharmD []  Theodis Shove, PharmD []  Peggyann Juba, PharmD []  Reuel Boom, PharmD   Positive urine culture Treated with Cephjalexin, organism sensitive to the same and no further patient follow-up is required at this time.  Harlon Flor [facility] 07/24/2019, 9:56 AM

## 2019-07-27 DIAGNOSIS — M542 Cervicalgia: Secondary | ICD-10-CM | POA: Diagnosis not present

## 2019-07-27 DIAGNOSIS — S12290A Other displaced fracture of third cervical vertebra, initial encounter for closed fracture: Secondary | ICD-10-CM | POA: Diagnosis not present

## 2019-07-27 DIAGNOSIS — S02119A Unspecified fracture of occiput, initial encounter for closed fracture: Secondary | ICD-10-CM | POA: Diagnosis not present

## 2019-07-27 DIAGNOSIS — S12191A Other nondisplaced fracture of second cervical vertebra, initial encounter for closed fracture: Secondary | ICD-10-CM | POA: Diagnosis not present

## 2019-07-29 DIAGNOSIS — F0390 Unspecified dementia without behavioral disturbance: Secondary | ICD-10-CM | POA: Diagnosis not present

## 2019-07-29 DIAGNOSIS — E43 Unspecified severe protein-calorie malnutrition: Secondary | ICD-10-CM | POA: Diagnosis not present

## 2019-07-29 DIAGNOSIS — I4891 Unspecified atrial fibrillation: Secondary | ICD-10-CM | POA: Diagnosis not present

## 2019-07-29 DIAGNOSIS — N189 Chronic kidney disease, unspecified: Secondary | ICD-10-CM | POA: Diagnosis not present

## 2019-07-29 DIAGNOSIS — E785 Hyperlipidemia, unspecified: Secondary | ICD-10-CM | POA: Diagnosis not present

## 2019-07-29 DIAGNOSIS — I509 Heart failure, unspecified: Secondary | ICD-10-CM | POA: Diagnosis not present

## 2019-07-29 DIAGNOSIS — G4733 Obstructive sleep apnea (adult) (pediatric): Secondary | ICD-10-CM | POA: Diagnosis not present

## 2019-07-29 DIAGNOSIS — N8110 Cystocele, unspecified: Secondary | ICD-10-CM | POA: Diagnosis not present

## 2019-07-29 DIAGNOSIS — N390 Urinary tract infection, site not specified: Secondary | ICD-10-CM | POA: Diagnosis not present

## 2019-07-29 DIAGNOSIS — S129XXD Fracture of neck, unspecified, subsequent encounter: Secondary | ICD-10-CM | POA: Diagnosis not present

## 2019-07-29 DIAGNOSIS — I130 Hypertensive heart and chronic kidney disease with heart failure and stage 1 through stage 4 chronic kidney disease, or unspecified chronic kidney disease: Secondary | ICD-10-CM | POA: Diagnosis not present

## 2019-07-29 DIAGNOSIS — R1310 Dysphagia, unspecified: Secondary | ICD-10-CM | POA: Diagnosis not present

## 2019-07-30 DIAGNOSIS — S129XXD Fracture of neck, unspecified, subsequent encounter: Secondary | ICD-10-CM | POA: Diagnosis not present

## 2019-07-30 DIAGNOSIS — E43 Unspecified severe protein-calorie malnutrition: Secondary | ICD-10-CM | POA: Diagnosis not present

## 2019-07-30 DIAGNOSIS — R1310 Dysphagia, unspecified: Secondary | ICD-10-CM | POA: Diagnosis not present

## 2019-07-30 DIAGNOSIS — I130 Hypertensive heart and chronic kidney disease with heart failure and stage 1 through stage 4 chronic kidney disease, or unspecified chronic kidney disease: Secondary | ICD-10-CM | POA: Diagnosis not present

## 2019-07-30 DIAGNOSIS — N390 Urinary tract infection, site not specified: Secondary | ICD-10-CM | POA: Diagnosis not present

## 2019-07-30 DIAGNOSIS — F0390 Unspecified dementia without behavioral disturbance: Secondary | ICD-10-CM | POA: Diagnosis not present

## 2019-07-31 DIAGNOSIS — N390 Urinary tract infection, site not specified: Secondary | ICD-10-CM | POA: Diagnosis not present

## 2019-07-31 DIAGNOSIS — F0390 Unspecified dementia without behavioral disturbance: Secondary | ICD-10-CM | POA: Diagnosis not present

## 2019-07-31 DIAGNOSIS — E43 Unspecified severe protein-calorie malnutrition: Secondary | ICD-10-CM | POA: Diagnosis not present

## 2019-07-31 DIAGNOSIS — S129XXD Fracture of neck, unspecified, subsequent encounter: Secondary | ICD-10-CM | POA: Diagnosis not present

## 2019-07-31 DIAGNOSIS — R1310 Dysphagia, unspecified: Secondary | ICD-10-CM | POA: Diagnosis not present

## 2019-07-31 DIAGNOSIS — I130 Hypertensive heart and chronic kidney disease with heart failure and stage 1 through stage 4 chronic kidney disease, or unspecified chronic kidney disease: Secondary | ICD-10-CM | POA: Diagnosis not present

## 2019-08-03 DIAGNOSIS — I130 Hypertensive heart and chronic kidney disease with heart failure and stage 1 through stage 4 chronic kidney disease, or unspecified chronic kidney disease: Secondary | ICD-10-CM | POA: Diagnosis not present

## 2019-08-03 DIAGNOSIS — S129XXD Fracture of neck, unspecified, subsequent encounter: Secondary | ICD-10-CM | POA: Diagnosis not present

## 2019-08-03 DIAGNOSIS — E43 Unspecified severe protein-calorie malnutrition: Secondary | ICD-10-CM | POA: Diagnosis not present

## 2019-08-03 DIAGNOSIS — R1310 Dysphagia, unspecified: Secondary | ICD-10-CM | POA: Diagnosis not present

## 2019-08-03 DIAGNOSIS — F0390 Unspecified dementia without behavioral disturbance: Secondary | ICD-10-CM | POA: Diagnosis not present

## 2019-08-03 DIAGNOSIS — N390 Urinary tract infection, site not specified: Secondary | ICD-10-CM | POA: Diagnosis not present

## 2019-08-05 DIAGNOSIS — I130 Hypertensive heart and chronic kidney disease with heart failure and stage 1 through stage 4 chronic kidney disease, or unspecified chronic kidney disease: Secondary | ICD-10-CM | POA: Diagnosis not present

## 2019-08-05 DIAGNOSIS — F0390 Unspecified dementia without behavioral disturbance: Secondary | ICD-10-CM | POA: Diagnosis not present

## 2019-08-05 DIAGNOSIS — R1310 Dysphagia, unspecified: Secondary | ICD-10-CM | POA: Diagnosis not present

## 2019-08-05 DIAGNOSIS — N390 Urinary tract infection, site not specified: Secondary | ICD-10-CM | POA: Diagnosis not present

## 2019-08-05 DIAGNOSIS — S129XXD Fracture of neck, unspecified, subsequent encounter: Secondary | ICD-10-CM | POA: Diagnosis not present

## 2019-08-05 DIAGNOSIS — E43 Unspecified severe protein-calorie malnutrition: Secondary | ICD-10-CM | POA: Diagnosis not present

## 2019-08-07 DIAGNOSIS — I130 Hypertensive heart and chronic kidney disease with heart failure and stage 1 through stage 4 chronic kidney disease, or unspecified chronic kidney disease: Secondary | ICD-10-CM | POA: Diagnosis not present

## 2019-08-07 DIAGNOSIS — R1310 Dysphagia, unspecified: Secondary | ICD-10-CM | POA: Diagnosis not present

## 2019-08-07 DIAGNOSIS — N390 Urinary tract infection, site not specified: Secondary | ICD-10-CM | POA: Diagnosis not present

## 2019-08-07 DIAGNOSIS — E43 Unspecified severe protein-calorie malnutrition: Secondary | ICD-10-CM | POA: Diagnosis not present

## 2019-08-07 DIAGNOSIS — S129XXD Fracture of neck, unspecified, subsequent encounter: Secondary | ICD-10-CM | POA: Diagnosis not present

## 2019-08-07 DIAGNOSIS — F0390 Unspecified dementia without behavioral disturbance: Secondary | ICD-10-CM | POA: Diagnosis not present

## 2019-08-10 DIAGNOSIS — S129XXD Fracture of neck, unspecified, subsequent encounter: Secondary | ICD-10-CM | POA: Diagnosis not present

## 2019-08-10 DIAGNOSIS — R1310 Dysphagia, unspecified: Secondary | ICD-10-CM | POA: Diagnosis not present

## 2019-08-10 DIAGNOSIS — F0390 Unspecified dementia without behavioral disturbance: Secondary | ICD-10-CM | POA: Diagnosis not present

## 2019-08-10 DIAGNOSIS — N390 Urinary tract infection, site not specified: Secondary | ICD-10-CM | POA: Diagnosis not present

## 2019-08-10 DIAGNOSIS — E43 Unspecified severe protein-calorie malnutrition: Secondary | ICD-10-CM | POA: Diagnosis not present

## 2019-08-10 DIAGNOSIS — I130 Hypertensive heart and chronic kidney disease with heart failure and stage 1 through stage 4 chronic kidney disease, or unspecified chronic kidney disease: Secondary | ICD-10-CM | POA: Diagnosis not present

## 2019-08-14 DIAGNOSIS — E43 Unspecified severe protein-calorie malnutrition: Secondary | ICD-10-CM | POA: Diagnosis not present

## 2019-08-14 DIAGNOSIS — F0390 Unspecified dementia without behavioral disturbance: Secondary | ICD-10-CM | POA: Diagnosis not present

## 2019-08-14 DIAGNOSIS — R1310 Dysphagia, unspecified: Secondary | ICD-10-CM | POA: Diagnosis not present

## 2019-08-14 DIAGNOSIS — I130 Hypertensive heart and chronic kidney disease with heart failure and stage 1 through stage 4 chronic kidney disease, or unspecified chronic kidney disease: Secondary | ICD-10-CM | POA: Diagnosis not present

## 2019-08-14 DIAGNOSIS — N390 Urinary tract infection, site not specified: Secondary | ICD-10-CM | POA: Diagnosis not present

## 2019-08-14 DIAGNOSIS — S129XXD Fracture of neck, unspecified, subsequent encounter: Secondary | ICD-10-CM | POA: Diagnosis not present

## 2019-08-16 DIAGNOSIS — N390 Urinary tract infection, site not specified: Secondary | ICD-10-CM | POA: Diagnosis not present

## 2019-08-16 DIAGNOSIS — R1310 Dysphagia, unspecified: Secondary | ICD-10-CM | POA: Diagnosis not present

## 2019-08-16 DIAGNOSIS — S129XXD Fracture of neck, unspecified, subsequent encounter: Secondary | ICD-10-CM | POA: Diagnosis not present

## 2019-08-16 DIAGNOSIS — I130 Hypertensive heart and chronic kidney disease with heart failure and stage 1 through stage 4 chronic kidney disease, or unspecified chronic kidney disease: Secondary | ICD-10-CM | POA: Diagnosis not present

## 2019-08-16 DIAGNOSIS — F0390 Unspecified dementia without behavioral disturbance: Secondary | ICD-10-CM | POA: Diagnosis not present

## 2019-08-16 DIAGNOSIS — E43 Unspecified severe protein-calorie malnutrition: Secondary | ICD-10-CM | POA: Diagnosis not present

## 2019-08-17 DIAGNOSIS — E43 Unspecified severe protein-calorie malnutrition: Secondary | ICD-10-CM | POA: Diagnosis not present

## 2019-08-17 DIAGNOSIS — N390 Urinary tract infection, site not specified: Secondary | ICD-10-CM | POA: Diagnosis not present

## 2019-08-17 DIAGNOSIS — R1310 Dysphagia, unspecified: Secondary | ICD-10-CM | POA: Diagnosis not present

## 2019-08-17 DIAGNOSIS — F0390 Unspecified dementia without behavioral disturbance: Secondary | ICD-10-CM | POA: Diagnosis not present

## 2019-08-17 DIAGNOSIS — S129XXD Fracture of neck, unspecified, subsequent encounter: Secondary | ICD-10-CM | POA: Diagnosis not present

## 2019-08-17 DIAGNOSIS — I130 Hypertensive heart and chronic kidney disease with heart failure and stage 1 through stage 4 chronic kidney disease, or unspecified chronic kidney disease: Secondary | ICD-10-CM | POA: Diagnosis not present

## 2019-08-18 DIAGNOSIS — N390 Urinary tract infection, site not specified: Secondary | ICD-10-CM | POA: Diagnosis not present

## 2019-08-18 DIAGNOSIS — I130 Hypertensive heart and chronic kidney disease with heart failure and stage 1 through stage 4 chronic kidney disease, or unspecified chronic kidney disease: Secondary | ICD-10-CM | POA: Diagnosis not present

## 2019-08-18 DIAGNOSIS — E43 Unspecified severe protein-calorie malnutrition: Secondary | ICD-10-CM | POA: Diagnosis not present

## 2019-08-18 DIAGNOSIS — R1310 Dysphagia, unspecified: Secondary | ICD-10-CM | POA: Diagnosis not present

## 2019-08-18 DIAGNOSIS — F0390 Unspecified dementia without behavioral disturbance: Secondary | ICD-10-CM | POA: Diagnosis not present

## 2019-08-18 DIAGNOSIS — S129XXD Fracture of neck, unspecified, subsequent encounter: Secondary | ICD-10-CM | POA: Diagnosis not present

## 2019-08-19 DIAGNOSIS — F0390 Unspecified dementia without behavioral disturbance: Secondary | ICD-10-CM | POA: Diagnosis not present

## 2019-08-19 DIAGNOSIS — S129XXD Fracture of neck, unspecified, subsequent encounter: Secondary | ICD-10-CM | POA: Diagnosis not present

## 2019-08-19 DIAGNOSIS — E43 Unspecified severe protein-calorie malnutrition: Secondary | ICD-10-CM | POA: Diagnosis not present

## 2019-08-19 DIAGNOSIS — R1310 Dysphagia, unspecified: Secondary | ICD-10-CM | POA: Diagnosis not present

## 2019-08-19 DIAGNOSIS — N390 Urinary tract infection, site not specified: Secondary | ICD-10-CM | POA: Diagnosis not present

## 2019-08-19 DIAGNOSIS — I130 Hypertensive heart and chronic kidney disease with heart failure and stage 1 through stage 4 chronic kidney disease, or unspecified chronic kidney disease: Secondary | ICD-10-CM | POA: Diagnosis not present

## 2019-08-20 DIAGNOSIS — R1310 Dysphagia, unspecified: Secondary | ICD-10-CM | POA: Diagnosis not present

## 2019-08-20 DIAGNOSIS — E43 Unspecified severe protein-calorie malnutrition: Secondary | ICD-10-CM | POA: Diagnosis not present

## 2019-08-20 DIAGNOSIS — F0390 Unspecified dementia without behavioral disturbance: Secondary | ICD-10-CM | POA: Diagnosis not present

## 2019-08-20 DIAGNOSIS — I130 Hypertensive heart and chronic kidney disease with heart failure and stage 1 through stage 4 chronic kidney disease, or unspecified chronic kidney disease: Secondary | ICD-10-CM | POA: Diagnosis not present

## 2019-08-20 DIAGNOSIS — S129XXD Fracture of neck, unspecified, subsequent encounter: Secondary | ICD-10-CM | POA: Diagnosis not present

## 2019-08-20 DIAGNOSIS — N390 Urinary tract infection, site not specified: Secondary | ICD-10-CM | POA: Diagnosis not present

## 2019-08-25 DEATH — deceased

## 2020-11-09 IMAGING — CT CT ABD-PELV W/ CM
2 of 5 series · 15 of 46 positions shown, 17 images · IV contrast (Omnipaque)
Comparison: None.

CLINICAL DATA: Abdominal pain, diverticulitis suspected

EXAM:
CT ABDOMEN AND PELVIS WITH CONTRAST
TECHNIQUE: Multidetector CT imaging of the abdomen and pelvis was performed
using the standard protocol following bolus administration of
intravenous contrast.
CONTRAST:  100mL OMNIPAQUE IOHEXOL 300 MG/ML  SOLN

[Series 2: axial st · axial · 0.76mm/px · z∈[-436,-36]mm · 12 of 91 slices shown, 14 images]
[im 6/91  soft-tissue]
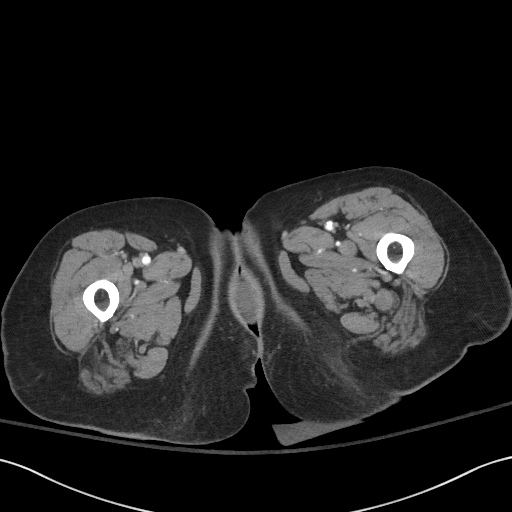
[im 6/91  bone]
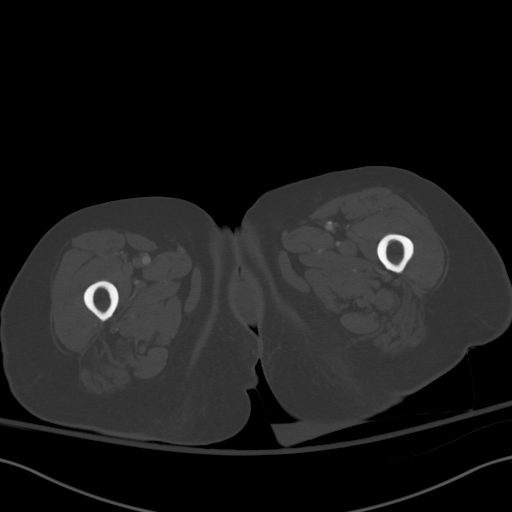
[im 16/91  soft-tissue]
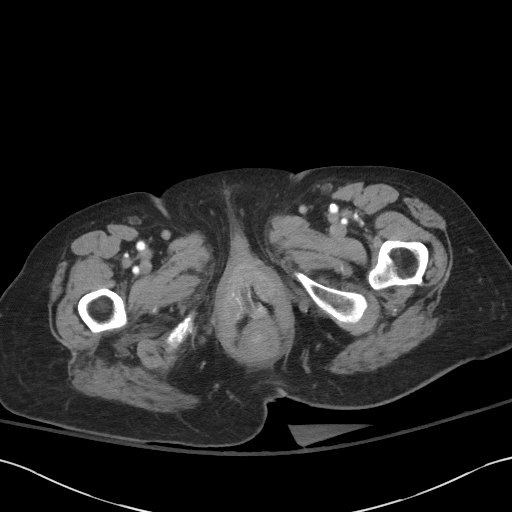
[im 21/91  soft-tissue]
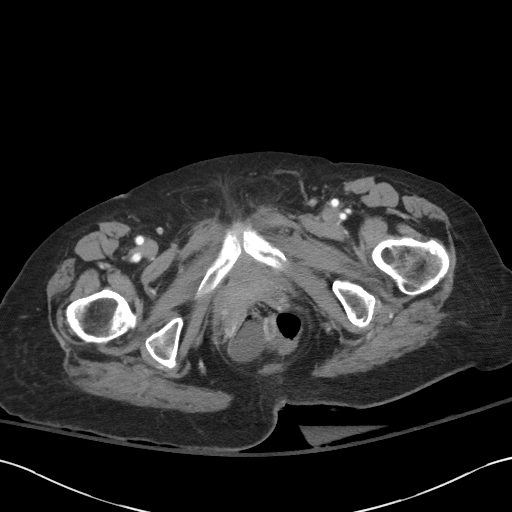
[im 26/91  soft-tissue]
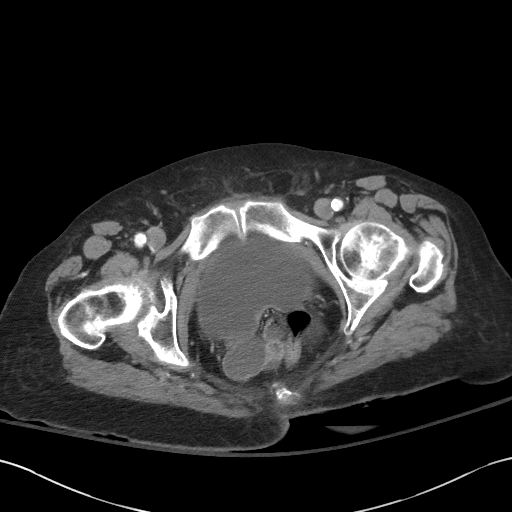
[im 36/91  soft-tissue]
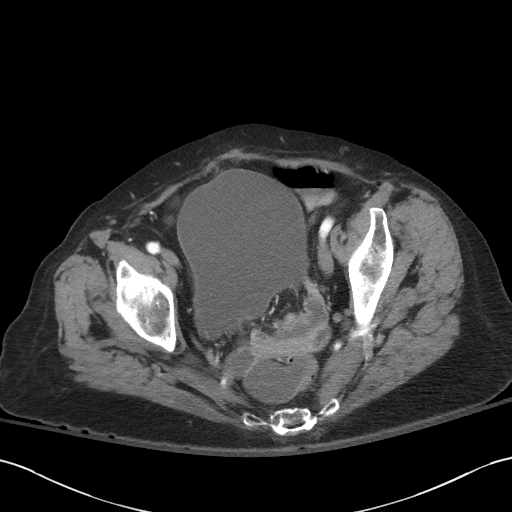
[im 41/91  soft-tissue]
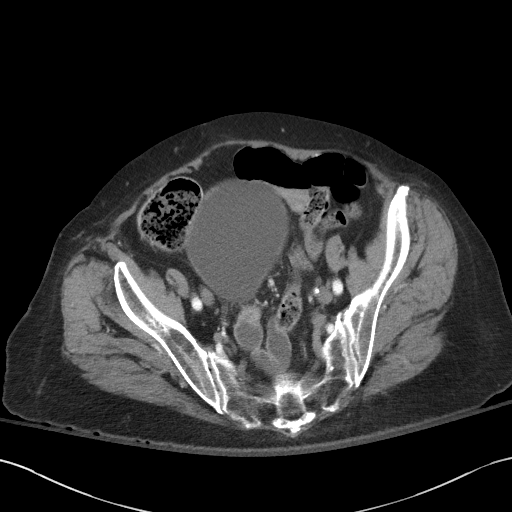
[im 51/91  soft-tissue]
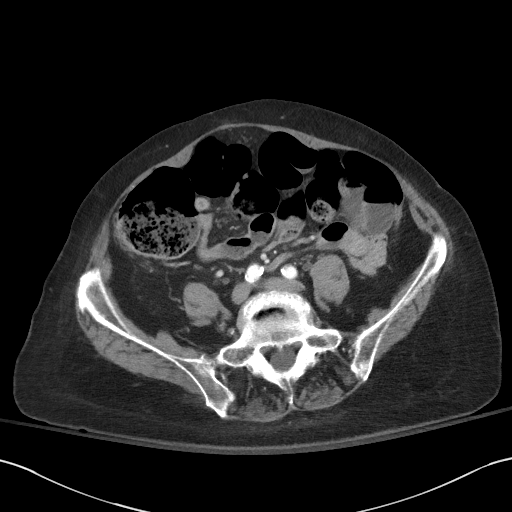
[im 56/91  soft-tissue]
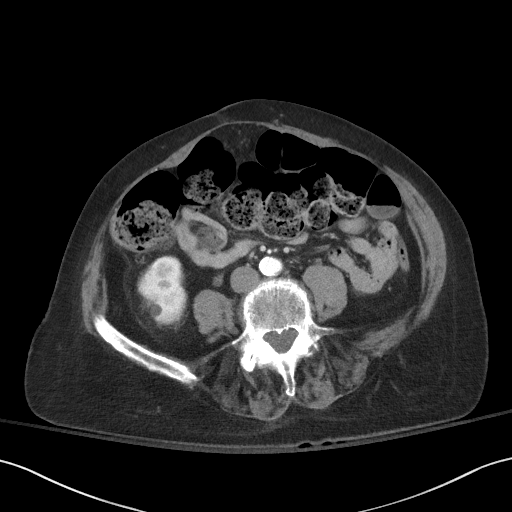
[im 66/91  soft-tissue]
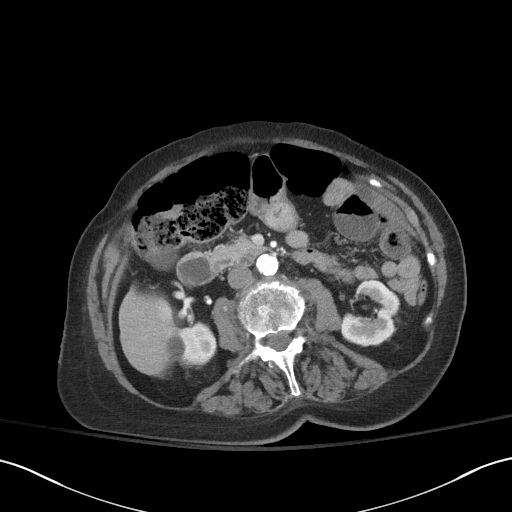
[im 66/91  bone]
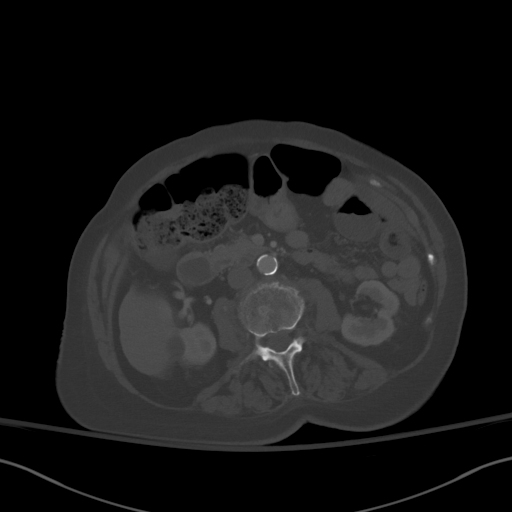
[im 71/91  soft-tissue]
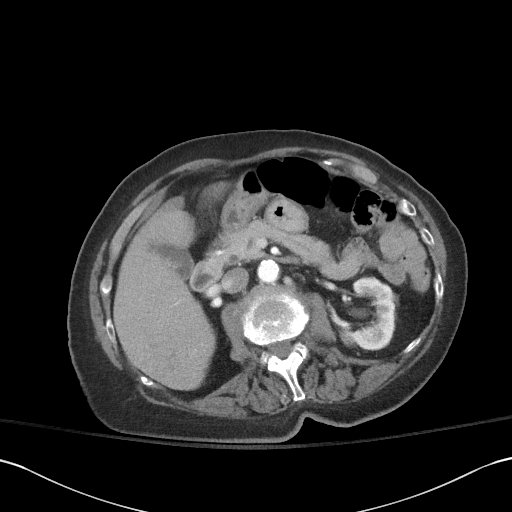
[im 76/91  soft-tissue]
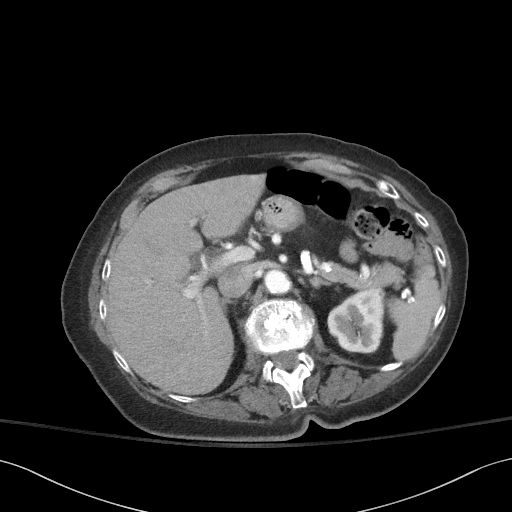
[im 86/91  soft-tissue]
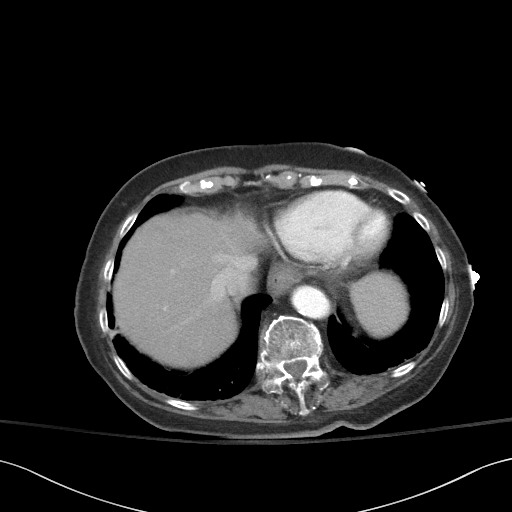

[Series 4: coronal st · coronal · 0.73mm/px · 3 of 91 slices shown]
[im 31/91  soft-tissue]
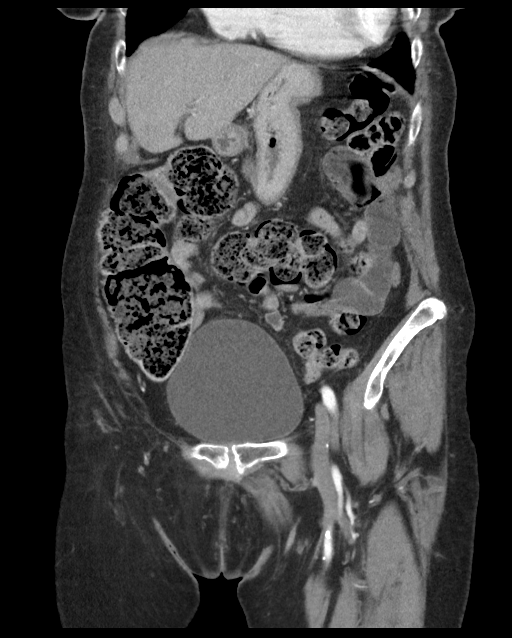
[im 41/91  soft-tissue]
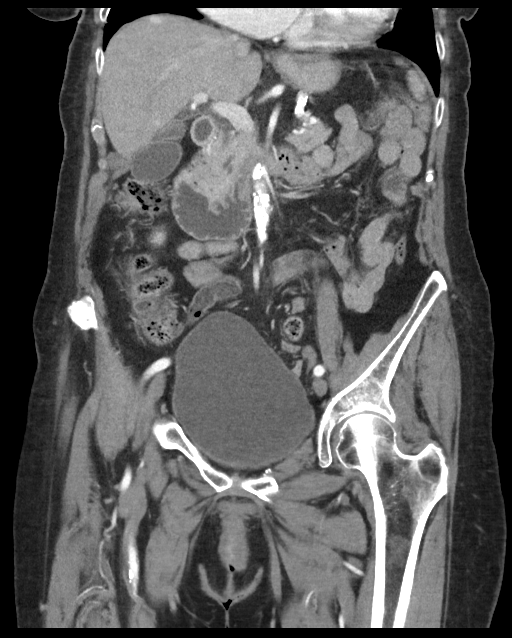
[im 51/91  soft-tissue]
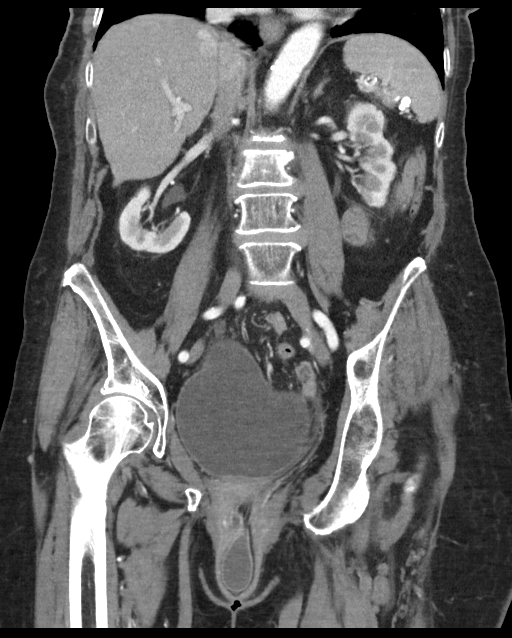

[15 of 46 positions shown; findings below may reference images not displayed]

FINDINGS: Lower chest: Basilar atelectatic changes. Cardiomegaly with right
atrial enlargement. No pericardial effusion.

Hepatobiliary: No focal liver abnormality is seen. No gallstones,
pericholecystic fluid or gallbladder wall thickening. Prominent fold
towards the gallbladder neck. No biliary ductal dilatation.

Pancreas: Mild pancreatic atrophy. No peripancreatic inflammation or
ductal dilatation.

Spleen: Extensive splenic hilar vascular calcifications. No focal
splenic lesion. Normal splenic size.

Adrenals/Urinary Tract: Nodular thickening of the adrenal glands
without discrete adrenal lesion. Fluid attenuation cyst in the upper
pole right kidney measuring 1.7 cm. Scattered additional
subcentimeter hypoattenuating foci are too small to fully
characterize on CT imaging but statistically likely benign. Right
extrarenal pelvis. No extravasation of contrast is seen on excretory
phase delayed imaging. Kidneys are otherwise unremarkable, without
renal calculi, suspicious lesion, or hydronephrosis. Bladder is
distended without mural thickening.

Stomach/Bowel: Small hiatal hernia. Distal stomach and duodenal
sweep are unremarkable. No small bowel dilatation or wall
thickening. Portion of the small bowel protrudes into the pelvic
floor prolapse. No resulting obstruction or vascular compromise is
evident. Fecalization of the distal small bowel contents.
Nonvisualization of the appendix. No pericecal inflammation to
suggest an occult appendicitis. Moderate volume of stool in the
proximal colon. Redundancy of the descending and sigmoid colon.
These distal segments of colon also appear fluid-filled. The rectum
also appears prolapsed with mural thickening and some hyperemia.

Vascular/Lymphatic: Atherosclerotic plaque within the normal caliber
aorta. No suspicious or enlarged lymph nodes in the included
lymphatic chains.

Reproductive: Uterus appears surgically absent. No concerning
adnexal lesions.

Other: Marked pelvic floor prolapse as above. Small fat containing
inguinal hernias. No free fluid or free air in the abdomen or
pelvis.

Musculoskeletal: Insert bones demineralized No acute osseous
abnormality or suspicious osseous lesion. Multilevel degenerative
changes are present in the imaged portions of the spine. Mild
dextrocurvature of the spine, possibly positional. For additional
degenerative changes in the hips.
IMPRESSION: 1. Marked pelvic floor prolapse with protrusion of the rectum, base
of the bladder and portion of the small bowel albeit without frank
obstruction of this small bowel loop.
2. Rectal wall thickening and hyperemia, could reflect a
superimposed proctitis. Correlate with exam findings.
3. Distension of the urinary bladder may indicate some underlying
outlet obstruction.
4. Multilevel degenerative changes in the spine with Schmorl's node
formations. No acute fracture or osseous injury.
5. Small hiatal hernia.
6. Uterus appears surgically absent, correlate with surgical
history. Aortic Atherosclerosis ([id number]).

These results were called by telephone at the time of interpretation
on 07/21/2019 at [DATE] to provider SHUAJP ONANO , who verbally
acknowledged these results.

## 2021-04-12 NOTE — Telephone Encounter (Signed)
From previous role with [place]
